# Patient Record
Sex: Female | Born: 1955 | Race: Black or African American | Hispanic: No | Smoking: Never smoker
Health system: Southern US, Community
[De-identification: ages and names within clinical notes are randomized; demographics above are authoritative.]

## PROBLEM LIST (undated history)

## (undated) DIAGNOSIS — R5383 Other fatigue: Secondary | ICD-10-CM

## (undated) DIAGNOSIS — F419 Anxiety disorder, unspecified: Secondary | ICD-10-CM

## (undated) DIAGNOSIS — K219 Gastro-esophageal reflux disease without esophagitis: Secondary | ICD-10-CM

## (undated) DIAGNOSIS — D649 Anemia, unspecified: Secondary | ICD-10-CM

## (undated) DIAGNOSIS — E559 Vitamin D deficiency, unspecified: Secondary | ICD-10-CM

## (undated) DIAGNOSIS — Z9289 Personal history of other medical treatment: Secondary | ICD-10-CM

## (undated) DIAGNOSIS — I10 Essential (primary) hypertension: Secondary | ICD-10-CM

## (undated) DIAGNOSIS — M1990 Unspecified osteoarthritis, unspecified site: Secondary | ICD-10-CM

## (undated) DIAGNOSIS — J45909 Unspecified asthma, uncomplicated: Secondary | ICD-10-CM

## (undated) HISTORY — DX: Other fatigue: R53.83

## (undated) HISTORY — DX: Vitamin D deficiency, unspecified: E55.9

## (undated) HISTORY — PX: REDUCTION MAMMAPLASTY: SUR839

## (undated) HISTORY — PX: ROTATOR CUFF REPAIR: SHX139

## (undated) HISTORY — DX: Essential (primary) hypertension: I10

## (undated) HISTORY — PX: CHOLECYSTECTOMY: SHX55

## (undated) HISTORY — DX: Unspecified asthma, uncomplicated: J45.909

## (undated) HISTORY — PX: CARPAL TUNNEL RELEASE: SHX101

## (undated) HISTORY — DX: Anemia, unspecified: D64.9

---

## 2004-02-02 ENCOUNTER — Encounter: Admission: RE | Admit: 2004-02-02 | Discharge: 2004-05-02 | Payer: Self-pay | Admitting: Orthopedic Surgery

## 2004-04-26 ENCOUNTER — Encounter: Admission: RE | Admit: 2004-04-26 | Discharge: 2004-04-26 | Payer: Self-pay | Admitting: Orthopedic Surgery

## 2004-05-03 ENCOUNTER — Encounter: Admission: RE | Admit: 2004-05-03 | Discharge: 2004-06-12 | Payer: Self-pay | Admitting: Orthopedic Surgery

## 2004-06-11 ENCOUNTER — Encounter: Admission: RE | Admit: 2004-06-11 | Discharge: 2004-06-11 | Payer: Self-pay | Admitting: Orthopedic Surgery

## 2004-07-30 ENCOUNTER — Ambulatory Visit (HOSPITAL_BASED_OUTPATIENT_CLINIC_OR_DEPARTMENT_OTHER): Admission: RE | Admit: 2004-07-30 | Discharge: 2004-07-30 | Payer: Self-pay | Admitting: Orthopedic Surgery

## 2004-07-30 HISTORY — PX: SHOULDER ARTHROSCOPY: SHX128

## 2004-08-05 ENCOUNTER — Encounter: Admission: RE | Admit: 2004-08-05 | Discharge: 2004-11-03 | Payer: Self-pay | Admitting: Orthopedic Surgery

## 2004-11-04 ENCOUNTER — Encounter: Admission: RE | Admit: 2004-11-04 | Discharge: 2005-02-02 | Payer: Self-pay | Admitting: Orthopedic Surgery

## 2007-10-01 ENCOUNTER — Encounter: Admission: RE | Admit: 2007-10-01 | Discharge: 2007-10-01 | Payer: Self-pay | Admitting: Family Medicine

## 2007-10-14 ENCOUNTER — Ambulatory Visit: Payer: Self-pay | Admitting: Internal Medicine

## 2007-10-14 DIAGNOSIS — R0989 Other specified symptoms and signs involving the circulatory and respiratory systems: Secondary | ICD-10-CM

## 2007-10-14 DIAGNOSIS — R0609 Other forms of dyspnea: Secondary | ICD-10-CM

## 2007-10-14 DIAGNOSIS — R05 Cough: Secondary | ICD-10-CM

## 2007-11-02 ENCOUNTER — Telehealth (INDEPENDENT_AMBULATORY_CARE_PROVIDER_SITE_OTHER): Payer: Self-pay | Admitting: *Deleted

## 2010-11-01 NOTE — Op Note (Signed)
NAMEGAY, MONCIVAIS                 ACCOUNT NO.:  [id number]   MEDICAL RECORD NO.:  [id number]          PATIENT TYPE:  AMB   LOCATION:  DSC                          FACILITY:  [facility]   PHYSICIAN:  Robert A. Thurston Hole, M.D. DATE OF BIRTH:  [birth year]   DATE OF PROCEDURE:  07/30/2004  DATE OF DISCHARGE:                                 OPERATIVE REPORT   PREOPERATIVE DIAGNOSIS:  Right shoulder rotator cuff repair, re-tear.   POSTOPERATIVE DIAGNOSES:  1.  Right shoulder partial tear of previous rotator cuff repair.  2.  Right shoulder mild arthrofibrosis.   PROCEDURE:  1.  Right shoulder examination under anesthesia followed by manipulation.  2.  Right shoulder arthroscopic debridement of partial rotator cuff tear.   SURGEON:  Elana Alm. Thurston Hole, M.D.   ASSISTANT:  Julien Girt, P.A.   ANESTHESIA:  General.   OPERATIVE TIME:  30 minutes.   COMPLICATIONS:  None.   INDICATIONS FOR PROCEDURE:  Ms. Sparano is a 48-year woman who had undergone a  previous rotator cuff repair in June 2006. She has had persistent pain with  exam and MRI documenting a possible re-tear of her rotator cuff repair and  has failed conservative care from this and now to undergo arthroscopy.   DESCRIPTION:  Ms. Peregoy was brought to operating room on July 30, 2004  after an interscalene block had been placed in the holding room by  anesthesia. She was placed on the operative table in supine position. After  being placed under general anesthesia, her right shoulder was examined. She  had mild decrease range of motion with forward flexion 160, abduction 150  internal external rotation of 80 degrees. A gentle manipulation was carried  out breaking up soft adhesions and improving forward flexion to 175,  abduction to 175 internal-external rotation of 85 degrees. The shoulder  remained stable to ligamentous exam. She received Ancef 1 g IV  preoperatively for prophylaxis.  Her right shoulder and arm were then  prepped using sterile DuraPrep and draped using sterile technique.  Originally to the posterior arthroscopic portal, the arthroscope with a pump  attached was placed and through an anterior portal, an arthroscopic probe  was placed. On initial inspection, the articular cartilage in the  glenohumeral joint was intact, the anterior labrum intact, superior labrum,  biceps tendon and anchor intact,  anterior inferior labrum and anterior  inferior glenohumeral ligament complex was intact. The posterior labrum was  intact. Biceps tendon and biceps tendon anchor was intact. The rotator cuff  was noted from its previous repair, the sutures were in place. There was a  small partial tear of the leading edge of the anterior aspect of the  supraspinatus which was debrided but a complete tear was not found.  Inferior capsular recess free of pathology. Subacromial space was entered  and lateral arthroscopic portal was made. Mild amount of recurrent bursitis  was resected. No significant bony impingement was noted. The rotator cuff  repair was well visualized with the previous sutures and no new tearing was  noted on the bursal surface. After this was done,  the shoulder could be  brought through a full range of motion with no impingement on the repair. At  this point, it was felt that all pathology been satisfactorily addressed.  The instruments were removed. Portals closed with 3-0 nylon suture. Sterile  dressings and a sling applied. The patient awakened and taken to recovery  room in stable condition.   FOLLOW-UP CARE:  Ms. Ottaway will be followed as an outpatient on Percocet for  pain with early physical therapy.  See her back in the office in a week for  sutures out and follow-up.      RAW/MEDQ  D:  07/30/2004  T:  07/30/2004  Job:  [id number]

## 2013-04-25 ENCOUNTER — Emergency Department (INDEPENDENT_AMBULATORY_CARE_PROVIDER_SITE_OTHER)
Admission: EM | Admit: 2013-04-25 | Discharge: 2013-04-25 | Disposition: A | Discharge status: Home / Self Care | Payer: Worker's Compensation | Source: Home / Self Care | Attending: Emergency Medicine | Admitting: Emergency Medicine

## 2013-04-25 ENCOUNTER — Emergency Department (INDEPENDENT_AMBULATORY_CARE_PROVIDER_SITE_OTHER): Payer: Worker's Compensation

## 2013-04-25 ENCOUNTER — Encounter (HOSPITAL_COMMUNITY): Payer: Self-pay | Admitting: Emergency Medicine

## 2013-04-25 DIAGNOSIS — S4980XA Other specified injuries of shoulder and upper arm, unspecified arm, initial encounter: Secondary | ICD-10-CM

## 2013-04-25 DIAGNOSIS — I10 Essential (primary) hypertension: Secondary | ICD-10-CM

## 2013-04-25 DIAGNOSIS — S46001A Unspecified injury of muscle(s) and tendon(s) of the rotator cuff of right shoulder, initial encounter: Secondary | ICD-10-CM

## 2013-04-25 MED ORDER — HYDROCHLOROTHIAZIDE 25 MG PO TABS: 25.0000 mg | ORAL_TABLET | Freq: Every day | ORAL | Status: DC

## 2013-04-25 MED ORDER — OXYCODONE-ACETAMINOPHEN 5-325 MG PO TABS: ORAL_TABLET | ORAL | Status: DC

## 2013-04-25 MED ORDER — CLONIDINE HCL 0.1 MG PO TABS
0.1000 mg | ORAL_TABLET | Freq: Once | ORAL | Status: AC
  Administered 2013-04-25: 0.1 mg via ORAL

## 2013-04-25 MED ORDER — CLONIDINE HCL 0.1 MG PO TABS
ORAL_TABLET | ORAL | Status: AC
  Filled 2013-04-25: qty 1

## 2013-04-25 NOTE — ED Provider Notes (Signed)
Chief Complaint:   Chief Complaint  Patient presents with  . Shoulder Injury    History of Present Illness:   Becky Brooks is a 57 year old female who presents tonight with a workers comp right shoulder injury. This happened 6 days ago. 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 He ran into her shoulder, injuring her right shoulder and her neck area. She reported is a workers comp injury and ever since then she's had pain in the shoulder and it hurts to move in all directions. There is some swelling superiorly. Her shoulder has a fairly good range of motion. She denies any radiation down the arm, numbness, tingling, or weakness. She did have rotator cuff surgery twice about 5 years ago. She rates her pain as an 8/10.  She does not have a prior history of high blood pressure. She denies headaches, dizziness, shortness of breath, or chest pain.  Review of Systems:  Other than noted above, the patient denies any of the following symptoms: Systemic:  No fevers, chills, sweats, or aches.  No fatigue or tiredness. Musculoskeletal:  No joint pain, arthritis, bursitis, swelling, back pain, or neck pain. Neurological:  No muscular weakness, paresthesias, headache, or trouble with speech or coordination.  No dizziness.  PMFSH:  Past medical history, family history, social history, meds, and allergies were reviewed.    Physical Exam:   Vital signs:  BP 197/115  Pulse 99  Temp(Src) 98.2 F (36.8 C) (Oral)  SpO2 98% Gen:  Alert and oriented times 3.  In no distress. Musculoskeletal: Exam of the shoulder reveals pain to palpation superiorly and slight swelling. The shoulder has a full range of motion actively and passively with pain in all directions. Neer test was positive.  Hawkins test was positive.  Empty cans test was positive. Otherwise, all joints had a full a ROM with no swelling, bruising or deformity.   No edema, pulses full. Extremities were warm and pink.  Capillary refill was brisk.  Skin:  Clear, warm and dry.  No rash. Neuro:  Alert and oriented times 3.  Muscle strength was normal.  Sensation was intact to light touch.   Radiology:  Dg Shoulder Right  04/25/2013   CLINICAL DATA:  Right arm injury, shoulder pain  EXAM: RIGHT SHOULDER - 2+ VIEW  COMPARISON:  MRI 06/26/2004  FINDINGS: Glenohumeral joint is intact. No evidence of scapular fracture or humeral fracture. The acromioclavicular joint is intact. The  IMPRESSION: No acute osseous abnormality.   Electronically Signed   By: Genevive Bi M.D.   On: 04/25/2013 21:01   I reviewed the images independently and personally and concur with the radiologist's findings.  Course in Urgent Care Center:   She was given a sling for the shoulder. Her elevated blood pressure she was given clonidine 0.2 mg by mouth.  Assessment:  The primary encounter diagnosis was Rotator cuff injury, right, initial encounter. A diagnosis of Hypertension was also pertinent to this visit.  She'll need to followup with occupational medicine for the rotator cuff injury and with her own private primary care physician for blood pressure.  Plan:   1.  Meds:  The following meds were prescribed:   New Prescriptions   HYDROCHLOROTHIAZIDE (HYDRODIURIL) 25 MG TABLET    Take 1 tablet (25 mg total) by mouth daily.   OXYCODONE-ACETAMINOPHEN (PERCOCET) 5-325 MG PER TABLET    1 to  2 tablets every 6 hours as needed for pain.    2.  Patient Education/Counseling:  The patient was given appropriate handouts, self care instructions, and instructed in symptomatic relief.  Advised rest and ice.  3.  Follow up:  The patient was told to follow up if no better in 3 to 4 days, if becoming worse in any way, and given some red flag symptoms such as increasing pain which would prompt immediate return.  Follow up with the [facility] for the shoulder  injury, and with Dr. Maryelizabeth Rowan for the blood pressure.      Reuben Likes, MD 04/25/13 [phone]

## 2013-04-25 NOTE — ED Notes (Signed)
[employer] employee; reportedly sustained injury to shoulder today

## 2013-08-17 ENCOUNTER — Other Ambulatory Visit: Payer: Self-pay | Admitting: Family Medicine

## 2013-08-17 DIAGNOSIS — R229 Localized swelling, mass and lump, unspecified: Principal | ICD-10-CM

## 2013-08-17 DIAGNOSIS — IMO0002 Reserved for concepts with insufficient information to code with codable children: Secondary | ICD-10-CM

## 2013-08-23 ENCOUNTER — Ambulatory Visit
Admission: RE | Admit: 2013-08-23 | Discharge: 2013-08-23 | Disposition: A | Discharge status: Home / Self Care | Payer: BC Managed Care – PPO | Source: Ambulatory Visit | Attending: Family Medicine | Admitting: Family Medicine

## 2013-08-23 DIAGNOSIS — R229 Localized swelling, mass and lump, unspecified: Principal | ICD-10-CM

## 2013-08-23 DIAGNOSIS — IMO0002 Reserved for concepts with insufficient information to code with codable children: Secondary | ICD-10-CM

## 2014-08-03 ENCOUNTER — Ambulatory Visit
Admission: RE | Admit: 2014-08-03 | Discharge: 2014-08-03 | Disposition: A | Discharge status: Home / Self Care | Payer: BC Managed Care – PPO | Source: Ambulatory Visit | Attending: Family Medicine | Admitting: Family Medicine

## 2014-08-03 ENCOUNTER — Other Ambulatory Visit: Payer: Self-pay | Admitting: Family Medicine

## 2014-08-03 DIAGNOSIS — R0602 Shortness of breath: Secondary | ICD-10-CM

## 2015-04-03 ENCOUNTER — Ambulatory Visit
Admission: RE | Admit: 2015-04-03 | Discharge: 2015-04-03 | Disposition: A | Discharge status: Home / Self Care | Payer: BC Managed Care – PPO | Source: Ambulatory Visit | Attending: Family Medicine | Admitting: Family Medicine

## 2015-04-03 ENCOUNTER — Other Ambulatory Visit: Payer: Self-pay | Admitting: Family Medicine

## 2015-04-03 DIAGNOSIS — R109 Unspecified abdominal pain: Secondary | ICD-10-CM

## 2015-04-03 DIAGNOSIS — D649 Anemia, unspecified: Secondary | ICD-10-CM

## 2015-04-03 DIAGNOSIS — R1011 Right upper quadrant pain: Secondary | ICD-10-CM

## 2015-04-06 ENCOUNTER — Ambulatory Visit
Admission: RE | Admit: 2015-04-06 | Discharge: 2015-04-06 | Disposition: A | Discharge status: Home / Self Care | Payer: BC Managed Care – PPO | Source: Ambulatory Visit | Attending: Family Medicine | Admitting: Family Medicine

## 2015-04-06 DIAGNOSIS — D649 Anemia, unspecified: Secondary | ICD-10-CM

## 2015-04-06 DIAGNOSIS — R1011 Right upper quadrant pain: Secondary | ICD-10-CM

## 2016-05-16 DIAGNOSIS — Z9289 Personal history of other medical treatment: Secondary | ICD-10-CM

## 2016-05-16 HISTORY — DX: Personal history of other medical treatment: Z92.89

## 2016-05-28 ENCOUNTER — Other Ambulatory Visit: Payer: Self-pay | Admitting: Family Medicine

## 2016-05-28 ENCOUNTER — Ambulatory Visit
Admission: RE | Admit: 2016-05-28 | Discharge: 2016-05-28 | Disposition: A | Discharge status: Home / Self Care | Payer: BC Managed Care – PPO | Source: Ambulatory Visit | Attending: Family Medicine | Admitting: Family Medicine

## 2016-05-28 DIAGNOSIS — R1031 Right lower quadrant pain: Secondary | ICD-10-CM

## 2016-05-29 ENCOUNTER — Encounter: Payer: Self-pay | Admitting: Gastroenterology

## 2016-05-29 ENCOUNTER — Ambulatory Visit (INDEPENDENT_AMBULATORY_CARE_PROVIDER_SITE_OTHER): Payer: BC Managed Care – PPO | Admitting: Gastroenterology

## 2016-05-29 VITALS — BP 144/74 | HR 80 | Ht 64.0 in | Wt 204.0 lb

## 2016-05-29 DIAGNOSIS — D509 Iron deficiency anemia, unspecified: Secondary | ICD-10-CM | POA: Diagnosis not present

## 2016-05-29 DIAGNOSIS — K921 Melena: Secondary | ICD-10-CM | POA: Diagnosis not present

## 2016-05-29 DIAGNOSIS — R109 Unspecified abdominal pain: Secondary | ICD-10-CM | POA: Diagnosis not present

## 2016-05-29 MED ORDER — OMEPRAZOLE 40 MG PO CPDR: 40.0000 mg | DELAYED_RELEASE_CAPSULE | Freq: Every day | ORAL | 3 refills | Status: DC

## 2016-05-29 NOTE — Progress Notes (Addendum)
HPI :  60 y/o female with history of hypertension, anemia, asthma, referred for iron deficiency anemia, abdominal pain, heme positive stools.   Labs from primary care on 12/5 show Hgb of 6.8, WBC 8.7, MCV 68, plt 364 on 12/5 Ferritin 7 LFTs / BMP normal,   She had followed up blood work yesterday, 12/13 showing Hgb of 6.6, MCV 67, WBC 5.7  She is unsure of how long she has had anemia. She feels fatigued, ongoing for the past year or so, getting worse.  Abdominal pain in the RLQ. She thinks this has been ongoing for 2 years or so. The pain used to be intermittent but now feels tender and dull pain more frequently. She has daily pains, last hours at a time. She feels pain level can fluctuate. Pain rated 3-4/10 most of the time but can increase at times and wake her at night at times. She had an [place] in 10/16 with steatosis noted but no pathology otherwise. She has been periodically taking advil / ibuprofen / goody powder - she takes them for shoulder pains / neck pains. She thinks taking most days of the week on some weeks, other times just PRN. When she lies down on her left side it can help her pain.   She reports she sees "reddish brown stools" at times, and stools are FOBT (+). She has had dark stools on iron since November, now on ferrous sulfate TID, although mostly reddish brown. No tarry / sticky stools, denies melena. She is having variable BM frequency, anywhere from 0 to 2-3 times / day. She reports regular stool form. She is not sure if she has lost any weight. No prior colonoscopy. No FH of colon cancer. She is menopausal.    [place] 04/06/15 - steatosis  Past Medical History:  Diagnosis Date  . Anemia   . Asthma   . Essential hypertension   . Fatigue   . Vitamin D deficiency      Past Surgical History:  Procedure Laterality Date  . BREAST REDUCTION SURGERY    . CARPAL TUNNEL RELEASE    . CHOLECYSTECTOMY    . ROTATOR CUFF REPAIR     Family History  Problem Relation Age of  Onset  . Heart disease Father   . Thyroid disease Father     and pituitary gland   . Thyroid disease Sister   . Liver disease Maternal Grandfather   . Colon cancer Neg Hx   . Stomach cancer Neg Hx   . Rectal cancer Neg Hx   . Esophageal cancer Neg Hx   . Liver cancer Neg Hx    Social History  Substance Use Topics  . Smoking status: Never Smoker  . Smokeless tobacco: Never Used  . Alcohol use Yes     Comment: rarely   Current Outpatient Prescriptions  Medication Sig Dispense Refill  . albuterol (ACCUNEB) 0.63 MG/3ML nebulizer solution USE 1 VIAL IN NEBULIZER 3 TO 4 TIMES DAILY AS NEEDED  1  . ferrous sulfate 325 (65 FE) MG tablet Take 325 mg by mouth 3 (three) times daily.    . Fish Oil-Cholecalciferol (FISH OIL + D3) 1000-1000 MG-UNIT CAPS Take by mouth daily.    . methocarbamol (ROBAXIN) 500 MG tablet TAKE 1 TABLET BY MOUTH EVERY 6 HOURS AS NEEDED FOR SPASM  0  . hydrochlorothiazide (HYDRODIURIL) 25 MG tablet Take 1 tablet (25 mg total) by mouth daily. (Patient not taking: Reported on 05/29/2016) 30 tablet 0   No  current facility-administered medications for this visit.    Allergies  Allergen Reactions  . Aspirin     REACTION: makes stomach hurt     Review of Systems: All systems reviewed and negative except where noted in HPI.    Dg Abd Acute W/chest  Result Date: 05/28/2016 CLINICAL DATA:  Initial evaluation for intermittent right lower quadrant pain for 1 year, nausea, vomiting. Fatigue and dizziness. History of prior cholecystectomy. EXAM: DG ABDOMEN ACUTE W/ 1V CHEST COMPARISON:  Prior radiograph from 04/03/2015. FINDINGS: Cardiac and mediastinal silhouettes are within normal limits. Lungs are normally inflated. No focal infiltrates. No pulmonary edema or pleural effusion. No pneumothorax. Bowel gas pattern within normal limits without evidence for obstruction or ileus. No abnormal bowel wall thickening. No free air. No soft tissue mass or abnormal calcification.  Cholecystectomy clips noted. 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 Mild degenerative osteoarthritic changes noted about the hips. No acute osseous abnormality. IMPRESSION: 1. Nonobstructive bowel gas pattern with no radiographic evidence for acute intra-abdominal process. 2. No active cardiopulmonary disease. 3. Sequela of prior cholecystectomy. Electronically Signed   By: Jeannine Boga M.D.   On: 05/28/2016 13:58   Labs per HPI as above  Physical Exam: BP (!) 144/74   Pulse 80   Ht 5\' 4"  (1.626 m)   Wt 204 lb (92.5 kg)   BMI 35.02 kg/m  Constitutional: Pleasant,well-developed, female in no acute distress. HEENT: Normocephalic and atraumatic. Conjunctivae are normal. No scleral icterus. Neck supple.  Cardiovascular: Normal rate, regular rhythm.  Pulmonary/chest: Effort normal and breath sounds normal. No wheezing, rales or rhonchi. Abdominal: Soft, nondistended, right mid abdomen TTP without reound or guarding There are no masses palpable. No hepatomegaly. Extremities: no edema Lymphadenopathy: No cervical adenopathy noted. Neurological: Alert and oriented to person place and time. Skin: Skin is warm and dry. No rashes noted. Psychiatric: Normal mood and affect. Behavior is normal.   ASSESSMENT AND PLAN: 60 year old female here for new patient evaluation for severe iron deficiency anemia, RLQ abdominal pain, and blood in stools. Her hemoglobin has dropped from 6.8 to 6.6 over the past 10 days or so, although just recently started on iron, stools baseline dark with this but has had some red blood in the stools. Hemodynamically stable. She's had significant intermittent NSAID use, at risk for PUD but BUN is normal and denies symptoms of melena. She has had no prior CRC screening. While this appears to be more of a chronic process, given her severe anemia and ongoing fatigue recommend PRBC transfusion to be done within the next 24 hours and we will coordinate this  for her. Otherwise recommend upper and lower endoscopy to evaluate her anemia, tentatively have her scheduled for this within a week. I discussed risks and benefits and she wished to proceed. Given her chronic ongoing abdominal pain, also recommend CT scan of abdomen and pelvis with IV contrast. In the interim I asked her to avoid all NSAIDs and take omeprazole 40 mg once daily empirically given her NSAID use. She will also continue on iron 3 times a day. If she has worsening of symptoms or fails to respond to transfusion she will need admission to the hospital for expedited workup. She will contact us in this situation and will await follow up lab. Otherwise hope to have results of CT and endoscopy within upcoming few days.   Kasigluk Cellar, MD [facility] Pager [phone]

## 2016-05-29 NOTE — Patient Instructions (Signed)
If you are age 59 or older, your body mass index should be between 23-30. Your Body mass index is 35.02 kg/m. If this is out of the aforementioned range listed, please consider follow up with your Primary Care Provider.  If you are age 44 or younger, your body mass index should be between 19-25. Your Body mass index is 35.02 kg/m. If this is out of the aformentioned range listed, please consider follow up with your Primary Care Provider.   We have sent the following medications to your pharmacy for you to pick up at your convenience:  Omeprazole 40mg . Take once daily.  Please take Iron three times daily.  Please discontinue all NSAIDS.  You have been scheduled for a blood transfusion on 05/30/16 at 8:00 am. You will need to arrive at [facility]. Their number is [phone].  You are scheduled on 06/03/16 at [facility] for your CT scan. You should arrive 15 minutes prior to your appointment time for registration. Please follow the written instructions below on the day of your exam:  WARNING: IF YOU ARE ALLERGIC TO IODINE/X-RAY DYE, PLEASE NOTIFY RADIOLOGY IMMEDIATELY AT [phone]! YOU WILL BE GIVEN A 13 HOUR PREMEDICATION PREP.  1) Do not eat or drink anything after 11:00am (4 hours prior to your test) 2) You have been given 2 bottles of oral contrast to drink. The solution may taste               better if refrigerated, but do NOT add ice or any other liquid to this solution. Shake             well before drinking.    Drink 1 bottle of contrast @ 1:00pm (2 hours prior to your exam)  Drink 1 bottle of contrast @ 2:00pm(1 hour prior to your exam)  The purpose of you drinking the oral contrast is to aid in the visualization of your intestinal tract. The contrast solution may cause some diarrhea. Before your exam is started, you will be given a small amount of fluid to drink. Depending on your individual set of symptoms, you may also receive an intravenous injection of  x-ray contrast/dye. Plan on being at [facility] for 30 minutes or longer, depending on the type of exam you are having performed.  This test typically takes 30-45 minutes to complete.  If you have any questions regarding your exam or if you need to reschedule, you may call [facility] at [phone] between the hours of 8:00 am and 5:00 pm, Monday-Friday.  ________________________________________________________________________

## 2016-05-30 ENCOUNTER — Telehealth: Payer: Self-pay

## 2016-05-30 ENCOUNTER — Other Ambulatory Visit: Payer: Self-pay

## 2016-05-30 ENCOUNTER — Ambulatory Visit (HOSPITAL_COMMUNITY)
Admission: RE | Admit: 2016-05-30 | Discharge: 2016-05-30 | Disposition: A | Discharge status: Home / Self Care | Payer: BC Managed Care – PPO | Source: Ambulatory Visit | Attending: Gastroenterology | Admitting: Gastroenterology

## 2016-05-30 DIAGNOSIS — D509 Iron deficiency anemia, unspecified: Secondary | ICD-10-CM

## 2016-05-30 LAB — PREPARE RBC (CROSSMATCH)

## 2016-05-30 LAB — ABO/RH: ABO/RH(D): O POS

## 2016-05-30 MED ORDER — SODIUM CHLORIDE 0.9 % IV SOLN: Freq: Once | INTRAVENOUS | Status: DC

## 2016-05-30 MED ORDER — SODIUM CHLORIDE 0.9 % IV SOLN
Freq: Once | INTRAVENOUS | Status: DC
  Administered 2016-05-30: 10:00:00 via INTRAVENOUS

## 2016-05-30 NOTE — Progress Notes (Signed)
Half way through 2nd unit of PRBC. C/O pressure pain type in chest. 2/10. "Uneasy feeling. VITAL signs 172/70 pulse 79 rep 20 100 % oximeter. Dr. Enis Gash notified.

## 2016-05-30 NOTE — Progress Notes (Signed)
Dr. Havery Moros recommends watching Becky Brooks and if chest pressure/pain continues to have Becky Brooks go to the ED. Becky Brooks aware and will include in discharge instructions.

## 2016-05-30 NOTE — Telephone Encounter (Signed)
Spoke to patient, she is feeling much better now. I let her know that she needs to come in on Monday to have her CBC rechecked. Also let her know that if her symptoms get worse than she needs to go the ED for evaluation, she voices her understanding.

## 2016-05-30 NOTE — Telephone Encounter (Signed)
-----   Message from Manus Gunning, MD sent at 05/29/2016  8:12 PM EST ----- Becky Brooks, This patient is scheduled for a blood transfusion, I believe for Friday. Can you assist in checking where and when (nt sure where [facility] coordinated this), and I would like her to have 2 units PRBC (Initially may have been just one ordered). She should have a follow up CBC on Monday. If she has any worsening of symptoms over the weekend she would need to go to the hospital for evaluation. I am trying to expedite her workup as best I can as outpatient. Thanks

## 2016-05-30 NOTE — Telephone Encounter (Signed)
Thanks for letting me know, we will await CBC Monday. She had 2 units PRBC today.

## 2016-05-30 NOTE — Discharge Instructions (Signed)

## 2016-05-30 NOTE — Progress Notes (Signed)
Procedure: Infusion of 2 units PRBC's  Provider: Dr. Havery Moros   Tolerated infusion well. No reaction. Alert, oriented and ambulatory at time of discharge. Went over discharge instructions and patient states an understanding.

## 2016-06-02 ENCOUNTER — Other Ambulatory Visit (INDEPENDENT_AMBULATORY_CARE_PROVIDER_SITE_OTHER): Payer: BC Managed Care – PPO

## 2016-06-02 DIAGNOSIS — D509 Iron deficiency anemia, unspecified: Secondary | ICD-10-CM

## 2016-06-02 LAB — CBC WITH DIFFERENTIAL/PLATELET
Basophils Absolute: 0.1 10*3/uL (ref 0.0–0.1)
Basophils Relative: 0.8 % (ref 0.0–3.0)
EOS PCT: 5.1 % — AB (ref 0.0–5.0)
Eosinophils Absolute: 0.4 10*3/uL (ref 0.0–0.7)
HCT: 29.6 % — ABNORMAL LOW (ref 36.0–46.0)
Hemoglobin: 9.3 g/dL — ABNORMAL LOW (ref 12.0–15.0)
LYMPHS ABS: 1.9 10*3/uL (ref 0.7–4.0)
Lymphocytes Relative: 25.2 % (ref 12.0–46.0)
MCHC: 31.4 g/dL (ref 30.0–36.0)
MONOS PCT: 7 % (ref 3.0–12.0)
Monocytes Absolute: 0.5 10*3/uL (ref 0.1–1.0)
NEUTROS ABS: 4.6 10*3/uL (ref 1.4–7.7)
NEUTROS PCT: 61.9 % (ref 43.0–77.0)
PLATELETS: 468 10*3/uL — AB (ref 150.0–400.0)
RBC: 4.12 Mil/uL (ref 3.87–5.11)
RDW: 24.4 % — ABNORMAL HIGH (ref 11.5–15.5)
WBC: 7.5 10*3/uL (ref 4.0–10.5)

## 2016-06-02 LAB — TYPE AND SCREEN
Blood Product Expiration Date: 201712272359
Blood Product Expiration Date: 201801012359
ISSUE DATE / TIME: 201712150952
ISSUE DATE / TIME: 201712151000
UNIT TYPE AND RH: 5100
UNIT TYPE AND RH: 5100

## 2016-06-03 ENCOUNTER — Encounter (HOSPITAL_COMMUNITY): Payer: Self-pay

## 2016-06-03 ENCOUNTER — Ambulatory Visit (HOSPITAL_COMMUNITY)
Admission: RE | Admit: 2016-06-03 | Discharge: 2016-06-03 | Disposition: A | Discharge status: Home / Self Care | Payer: BC Managed Care – PPO | Source: Ambulatory Visit | Attending: Gastroenterology | Admitting: Gastroenterology

## 2016-06-03 DIAGNOSIS — R109 Unspecified abdominal pain: Secondary | ICD-10-CM | POA: Diagnosis present

## 2016-06-03 DIAGNOSIS — K921 Melena: Secondary | ICD-10-CM | POA: Diagnosis present

## 2016-06-03 DIAGNOSIS — I7090 Unspecified atherosclerosis: Secondary | ICD-10-CM | POA: Diagnosis not present

## 2016-06-03 DIAGNOSIS — D509 Iron deficiency anemia, unspecified: Secondary | ICD-10-CM | POA: Insufficient documentation

## 2016-06-03 MED ORDER — IOPAMIDOL (ISOVUE-300) INJECTION 61%
INTRAVENOUS | Status: AC
  Administered 2016-06-03: 100 mL
  Filled 2016-06-03: qty 100

## 2016-06-03 MED ORDER — IOPAMIDOL (ISOVUE-300) INJECTION 61%
INTRAVENOUS | Status: AC
  Filled 2016-06-03: qty 100

## 2016-06-05 ENCOUNTER — Telehealth: Payer: Self-pay | Admitting: Gastroenterology

## 2016-06-05 ENCOUNTER — Encounter: Payer: Self-pay | Admitting: Gastroenterology

## 2016-06-05 ENCOUNTER — Ambulatory Visit (AMBULATORY_SURGERY_CENTER): Payer: BC Managed Care – PPO | Admitting: Gastroenterology

## 2016-06-05 ENCOUNTER — Encounter: Payer: BC Managed Care – PPO | Admitting: Gastroenterology

## 2016-06-05 VITALS — BP 135/70 | HR 72 | Temp 98.4°F | Resp 17 | Ht 64.0 in | Wt 204.0 lb

## 2016-06-05 DIAGNOSIS — R935 Abnormal findings on diagnostic imaging of other abdominal regions, including retroperitoneum: Secondary | ICD-10-CM

## 2016-06-05 DIAGNOSIS — D374 Neoplasm of uncertain behavior of colon: Secondary | ICD-10-CM

## 2016-06-05 DIAGNOSIS — D508 Other iron deficiency anemias: Secondary | ICD-10-CM | POA: Diagnosis present

## 2016-06-05 HISTORY — PX: COLONOSCOPY: SHX174

## 2016-06-05 MED ORDER — SODIUM CHLORIDE 0.9 % IV SOLN: 500.0000 mL | INTRAVENOUS | Status: AC

## 2016-06-05 NOTE — Progress Notes (Signed)
Assisted patient to bathroom to aide in expelling of air. Stated I passed some air and feel better.

## 2016-06-05 NOTE — Progress Notes (Signed)
Pathology sent [name] per D.O.

## 2016-06-05 NOTE — Telephone Encounter (Signed)
Called patient. 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 She had this scheduled ASAP to evaluate her colon.   I spoke with Erasmo Downer who will speak with [facility], there must be miscommunication about her situation on her insurance side, as this is scheduled in the [place]. We will proceed with colonoscopy which is scheduled at 130, advised patient to show up at 1230 and this issue will be dealt with her insurance company in the interim. Please add this case back to the schedule at 130 for colonoscopy. Thank you

## 2016-06-05 NOTE — Progress Notes (Signed)
A and O x3. Report to RN. Tolerated MAC anesthesia well. 

## 2016-06-05 NOTE — Telephone Encounter (Signed)
Patient called at 11:00 to cancel colon appointment at 1:30. States she called her insurance this morning and ins told her that she would have to pay $1000 unless the procedure was done in office. Pt states she doesn't have that money and has to cancel. Do you want to bill?

## 2016-06-05 NOTE — Progress Notes (Signed)
Called to room to assist during endoscopic procedure.  Patient ID and intended procedure confirmed with present staff. Received instructions for my participation in the procedure from the performing physician.  

## 2016-06-05 NOTE — Op Note (Signed)
[facility] Patient Name: Becky Brooks Procedure Date: 06/05/2016 12:54 PM MRN: [id number] Endoscopist: Remo Lipps P. Armbruster MD, MD Age: 60 Referring MD:  Date of Birth: May 28, 1956 Gender: Female Account #: [id number] Procedure:                Colonoscopy Indications:              anemia secondary to chronic blood loss, abdominal                            pain, +Abnormal CT of the GI tract suspicious for                            mass in the right colon / cecum Medicines:                Monitored Anesthesia Care Procedure:                Pre-Anesthesia Assessment:                           - Prior to the procedure, a History and Physical                            was performed, and patient medications and                            allergies were reviewed. The patient's tolerance of                            previous anesthesia was also reviewed. The risks                            and benefits of the procedure and the sedation                            options and risks were discussed with the patient.                            All questions were answered, and informed consent                            was obtained. Prior Anticoagulants: The patient has                            taken no previous anticoagulant or antiplatelet                            agents. ASA Grade Assessment: III - A patient with                            severe systemic disease. After reviewing the risks                            and benefits, the patient was deemed in  satisfactory condition to undergo the procedure.                           After obtaining informed consent, the colonoscope                            was passed under direct vision. Throughout the                            procedure, the patient's blood pressure, pulse, and                            oxygen saturations were monitored continuously. The                            Model CF-HQ190L  [id number]) scope was introduced                            through the anus with the intention of advancing to                            the cecum. The scope was advanced to the ascending                            colon before the procedure was aborted. Medications                            were given. The colonoscopy was performed without                            difficulty. The patient tolerated the procedure                            well. The quality of the bowel preparation was                            good. The rectum was photographed. Scope In: 1:33:59 PM Scope Out: 1:52:01 PM Scope Withdrawal Time: 0 hours 14 minutes 5 seconds  Total Procedure Duration: 0 hours 18 minutes 2 seconds  Findings:                 The perianal and digital rectal examinations were                            normal.                           A fungating partially obstructing large mass was                            found in the proximal ascending colon / cecum. The                            mass was circumferential with a narrowed lumen, and  was not able to traverse it with the colonoscope or                            identify cecal landmarks. Biopsies were taken with                            a cold forceps for histology. Area was tattooed                            with an injection of Spot (carbon black) just                            distal to the mass.                           Internal hemorrhoids were found during retroflexion.                           The exam was otherwise without abnormality. No                            other polyps Complications:            No immediate complications. Estimated blood loss:                            Minimal. Estimated Blood Loss:     Estimated blood loss was minimal. Impression:               - Likely malignant partially obstructing tumor in                            the proximal ascending colon / cecum Biopsied.                             Tattooed.                           - Internal hemorrhoids.                           - The examination was otherwise normal. Recommendation:           - Patient has a contact number available for                            emergencies. The signs and symptoms of potential                            delayed complications were discussed with the                            patient. Return to normal activities tomorrow.                            Written discharge instructions were provided to the  patient.                           - Resume previous diet.                           - Continue present medications.                           - Await pathology results. Additional staging                            imaging (CT chest recommended if mass is malignant,                            CEA)                           - General surgery consultation to discuss operative                            management.                           - Continue iron supplementation                           - Start Miralax once daily to keep stools loose,                            prevent obstruction Remo Lipps P. Armbruster MD, MD 06/05/2016 1:59:17 PM This report has been signed electronically.

## 2016-06-05 NOTE — Patient Instructions (Addendum)
Discharge instructions given. Biopsies taken. Resume previous medications. Await pathology. YOU HAD AN ENDOSCOPIC PROCEDURE TODAY AT [facility]:   Refer to the procedure report that was given to you for any specific questions about what was found during the examination.  If the procedure report does not answer your questions, please call your gastroenterologist to clarify.  If you requested that your care partner not be given the details of your procedure findings, then the procedure report has been included in a sealed envelope for you to review at your convenience later.  YOU SHOULD EXPECT: Some feelings of bloating in the abdomen. Passage of more gas than usual.  Walking can help get rid of the air that was put into your GI tract during the procedure and reduce the bloating. If you had a lower endoscopy (such as a colonoscopy or flexible sigmoidoscopy) you may notice spotting of blood in your stool or on the toilet paper. If you underwent a bowel prep for your procedure, you may not have a normal bowel movement for a few days.  Please Note:  You might notice some irritation and congestion in your nose or some drainage.  This is from the oxygen used during your procedure.  There is no need for concern and it should clear up in a day or so.  SYMPTOMS TO REPORT IMMEDIATELY:   Following lower endoscopy (colonoscopy or flexible sigmoidoscopy):  Excessive amounts of blood in the stool  Significant tenderness or worsening of abdominal pains  Swelling of the abdomen that is new, acute  Fever of 100F or higher   For urgent or emergent issues, a gastroenterologist can be reached at any hour by calling [phone].   DIET:  We do recommend a small meal at first, but then you may proceed to your regular diet.  Drink plenty of fluids but you should avoid alcoholic beverages for 24 hours.  ACTIVITY:  You should plan to take it easy for the rest of today and you should NOT DRIVE or  use heavy machinery until tomorrow (because of the sedation medicines used during the test).    FOLLOW UP: Our staff will call the number listed on your records the next business day following your procedure to check on you and address any questions or concerns that you may have regarding the information given to you following your procedure. If we do not reach you, we will leave a message.  However, if you are feeling well and you are not experiencing any problems, there is no need to return our call.  We will assume that you have returned to your regular daily activities without incident.  If any biopsies were taken you will be contacted by phone or by letter within the next 1-3 weeks.  Please call us at [phone] if you have not heard about the biopsies in 3 weeks.    SIGNATURES/CONFIDENTIALITY: You and/or your care partner have signed paperwork which will be entered into your electronic medical record.  These signatures attest to the fact that that the information above on your After Visit Summary has been reviewed and is understood.  Full responsibility of the confidentiality of this discharge information lies with you and/or your care-partner.

## 2016-06-06 ENCOUNTER — Other Ambulatory Visit: Payer: Self-pay | Admitting: Gastroenterology

## 2016-06-06 ENCOUNTER — Telehealth: Payer: Self-pay

## 2016-06-06 DIAGNOSIS — K6389 Other specified diseases of intestine: Secondary | ICD-10-CM

## 2016-06-06 NOTE — Telephone Encounter (Signed)
-----   Message from Manus Gunning, MD sent at 06/05/2016  4:45 PM EST ----- Irven Shelling, This patient had her colonoscopy today, large right sided mass, almost certainly colon cancer. Can you coordinate an appointment with general surgery as soon as possible. Dr. Johney Maine or Dr. Marcello Moores preferred but if the wait is long for them, first available. Thanks  Once I get the path back she will need CT chest and CEA level, will let you know. Thanks Dr. Loni Muse

## 2016-06-06 NOTE — Telephone Encounter (Signed)
  Follow up Call-  Call back number 06/05/2016  Post procedure Call Back phone  # [phone]  Permission to leave phone message Yes  Some recent data might be hidden     Patient questions:  Do you have a fever, pain , or abdominal swelling? No. Pain Score  0 *  Have you tolerated food without any problems? Yes.    Have you been able to return to your normal activities? Yes.    Do you have any questions about your discharge instructions: Diet   No. Medications  No. Follow up visit  No.  Do you have questions or concerns about your Care? No.  Actions: * If pain score is 4 or above: No action needed, pain <4.

## 2016-06-06 NOTE — Telephone Encounter (Signed)
Referral information faxed to [facility], requesting ASAP appointment. Prefer Dr. Johney Maine or Dr. Marcello Moores, if their schedules are booked to far out, then next available surgeon.

## 2016-06-06 NOTE — Telephone Encounter (Signed)
Called [facility], they have scheduled patient to see Dr. Donne Hazel on 06/23/16 at 3:40-patient aware of appointment.

## 2016-06-10 ENCOUNTER — Other Ambulatory Visit: Payer: Self-pay

## 2016-06-10 DIAGNOSIS — K6389 Other specified diseases of intestine: Secondary | ICD-10-CM

## 2016-06-11 ENCOUNTER — Ambulatory Visit (INDEPENDENT_AMBULATORY_CARE_PROVIDER_SITE_OTHER)
Admission: RE | Admit: 2016-06-11 | Discharge: 2016-06-11 | Disposition: A | Discharge status: Home / Self Care | Payer: BC Managed Care – PPO | Source: Ambulatory Visit | Attending: Gastroenterology | Admitting: Gastroenterology

## 2016-06-11 ENCOUNTER — Other Ambulatory Visit (INDEPENDENT_AMBULATORY_CARE_PROVIDER_SITE_OTHER): Payer: BC Managed Care – PPO

## 2016-06-11 DIAGNOSIS — K6389 Other specified diseases of intestine: Secondary | ICD-10-CM

## 2016-06-11 DIAGNOSIS — K639 Disease of intestine, unspecified: Secondary | ICD-10-CM | POA: Diagnosis not present

## 2016-06-11 LAB — CBC
HEMATOCRIT: 29.6 % — AB (ref 36.0–46.0)
HEMOGLOBIN: 9.2 g/dL — AB (ref 12.0–15.0)
MCHC: 31 g/dL (ref 30.0–36.0)
MCV: 71.5 fl — AB (ref 78.0–100.0)
PLATELETS: 411 10*3/uL — AB (ref 150.0–400.0)
RBC: 4.15 Mil/uL (ref 3.87–5.11)
RDW: 24.4 % — AB (ref 11.5–15.5)
WBC: 6.5 10*3/uL (ref 4.0–10.5)

## 2016-06-11 MED ORDER — IOPAMIDOL (ISOVUE-300) INJECTION 61%
80.0000 mL | Freq: Once | INTRAVENOUS | Status: AC | PRN
  Administered 2016-06-11: 80 mL via INTRAVENOUS

## 2016-06-12 LAB — CEA: CEA: 1.7 ng/mL

## 2016-06-24 ENCOUNTER — Other Ambulatory Visit: Payer: Self-pay | Admitting: General Surgery

## 2016-06-30 ENCOUNTER — Encounter (HOSPITAL_COMMUNITY): Payer: Self-pay

## 2016-06-30 ENCOUNTER — Encounter (HOSPITAL_COMMUNITY)
Admission: RE | Admit: 2016-06-30 | Discharge: 2016-06-30 | Disposition: A | Discharge status: Home / Self Care | Payer: BC Managed Care – PPO | Source: Ambulatory Visit | Attending: General Surgery | Admitting: General Surgery

## 2016-06-30 DIAGNOSIS — Z01812 Encounter for preprocedural laboratory examination: Secondary | ICD-10-CM | POA: Insufficient documentation

## 2016-06-30 DIAGNOSIS — Z01810 Encounter for preprocedural cardiovascular examination: Secondary | ICD-10-CM | POA: Insufficient documentation

## 2016-06-30 DIAGNOSIS — K6389 Other specified diseases of intestine: Secondary | ICD-10-CM

## 2016-06-30 HISTORY — DX: Unspecified osteoarthritis, unspecified site: M19.90

## 2016-06-30 HISTORY — DX: Gastro-esophageal reflux disease without esophagitis: K21.9

## 2016-06-30 HISTORY — DX: Anxiety disorder, unspecified: F41.9

## 2016-06-30 LAB — CBC WITH DIFFERENTIAL/PLATELET
BASOS PCT: 1 %
Basophils Absolute: 0.1 10*3/uL (ref 0.0–0.1)
Eosinophils Absolute: 0.3 10*3/uL (ref 0.0–0.7)
Eosinophils Relative: 6 %
HEMATOCRIT: 30.6 % — AB (ref 36.0–46.0)
HEMOGLOBIN: 9 g/dL — AB (ref 12.0–15.0)
LYMPHS PCT: 31 %
Lymphs Abs: 1.8 10*3/uL (ref 0.7–4.0)
MCH: 21.9 pg — AB (ref 26.0–34.0)
MCHC: 29.4 g/dL — ABNORMAL LOW (ref 30.0–36.0)
MCV: 74.5 fL — ABNORMAL LOW (ref 78.0–100.0)
MONOS PCT: 6 %
Monocytes Absolute: 0.3 10*3/uL (ref 0.1–1.0)
NEUTROS ABS: 3.3 10*3/uL (ref 1.7–7.7)
Neutrophils Relative %: 56 %
Platelets: 354 10*3/uL (ref 150–400)
RBC: 4.11 MIL/uL (ref 3.87–5.11)
RDW: 21 % — ABNORMAL HIGH (ref 11.5–15.5)
WBC: 5.8 10*3/uL (ref 4.0–10.5)

## 2016-06-30 LAB — COMPREHENSIVE METABOLIC PANEL
ALK PHOS: 95 U/L (ref 38–126)
ALT: 11 U/L — ABNORMAL LOW (ref 14–54)
ANION GAP: 8 (ref 5–15)
AST: 19 U/L (ref 15–41)
Albumin: 3.4 g/dL — ABNORMAL LOW (ref 3.5–5.0)
BILIRUBIN TOTAL: 0.5 mg/dL (ref 0.3–1.2)
BUN: 9 mg/dL (ref 6–20)
CALCIUM: 10 mg/dL (ref 8.9–10.3)
CO2: 25 mmol/L (ref 22–32)
Chloride: 105 mmol/L (ref 101–111)
Creatinine, Ser: 0.55 mg/dL (ref 0.44–1.00)
GFR calc non Af Amer: >60 mL/min (ref >60)
GLUCOSE: 86 mg/dL (ref 65–99)
Potassium: 3.7 mmol/L (ref 3.5–5.1)
Sodium: 138 mmol/L (ref 135–145)
TOTAL PROTEIN: 7.5 g/dL (ref 6.5–8.1)

## 2016-06-30 MED ORDER — CHLORHEXIDINE GLUCONATE CLOTH 2 % EX PADS: 6.0000 | MEDICATED_PAD | Freq: Once | CUTANEOUS | Status: DC

## 2016-06-30 NOTE — Pre-Procedure Instructions (Signed)
    Becky Brooks  06/30/2016      [id number] - [place], [place] [id number] [place] [id number] Phone: [phone] Fax: [phone]    Your procedure is scheduled on 07/03/16.  Report to [facility] Admitting at [place] A.[facility].  Call this number if you have problems the morning of surgery:  [phone]   Remember:  Do not eat food or drink liquids after midnight.  Take these medicines the morning of surgery with A SIP OF WATER --all inhalers,prilosec               Drink Breeze drink 2 hours prior to arrival  -- 930 AM  Do not wear jewelry, make-up or nail polish.  Do not wear lotions, powders, or perfumes, or deoderant.  Do not shave 48 hours prior to surgery.  Men may shave face and neck.  Do not bring valuables to the hospital.  [facility] is not responsible for any belongings or valuables.  Contacts, dentures or bridgework may not be worn into surgery.  Leave your suitcase in the car.  After surgery it may be brought to your room.  For patients admitted to the hospital, discharge time will be determined by your treatment team.  Patients discharged the day of surgery will not be allowed to drive home.   Name and phone number of your driver:      Please read over the following fact sheets that you were given.

## 2016-07-01 LAB — HEMOGLOBIN A1C
Hgb A1c MFr Bld: 5.4 % (ref 4.8–5.6)
MEAN PLASMA GLUCOSE: 108 mg/dL

## 2016-07-01 NOTE — Progress Notes (Signed)
Anesthesia Chart Review:  Pt is a 61 year old female scheduled for laparoscopic assisted R colectomy on 07/03/2016 with Rolm Bookbinder, MD.   PMH includes:  HTN, asthma, anemia, colon mass, GERD. Never smoker. BMI 36  BP (!) 162/75   Pulse 76   Temp 36.8 C (Oral)   Resp (!) 1   Ht 5\' 4"  (1.626 m)   Wt 211 lb 10.3 oz (96 kg)   SpO2 100%   BMI 36.33 kg/m    Medications include: albuterol, iron, prilosec.   Preoperative labs reviewed.  H/H 9.0/30.6. This is consistent with recent prior results. By notes, pt had critical anemia of 6.8 in 05/2016 requiring transfusion.  Dr. Donne Hazel is aware of PAT lab results. Will defer to him whether or not to obtain T&S DOS.   CT chest 06/11/16: No pulmonary nodules.  Airways normal.  EKG 06/30/16: NSR.   If no changes, I anticipate pt can proceed with surgery as scheduled.   Willeen Cass, FNP-BC [facility]/Anesthesiology Phone: [phone] 07/01/2016 2:40 PM

## 2016-07-03 ENCOUNTER — Encounter (HOSPITAL_COMMUNITY): Payer: Self-pay | Admitting: Certified Registered Nurse Anesthetist

## 2016-07-03 ENCOUNTER — Inpatient Hospital Stay (HOSPITAL_COMMUNITY): Payer: BC Managed Care – PPO | Admitting: Emergency Medicine

## 2016-07-03 ENCOUNTER — Encounter (HOSPITAL_COMMUNITY)
Admission: RE | Disposition: A | Discharge status: Home / Self Care | Payer: Self-pay | Source: Ambulatory Visit | Attending: General Surgery

## 2016-07-03 ENCOUNTER — Inpatient Hospital Stay (HOSPITAL_COMMUNITY): Payer: BC Managed Care – PPO | Admitting: Certified Registered Nurse Anesthetist

## 2016-07-03 ENCOUNTER — Inpatient Hospital Stay (HOSPITAL_COMMUNITY)
Admission: RE | Admit: 2016-07-03 | Discharge: 2016-07-11 | DRG: 330 | Disposition: A | Discharge status: Home / Self Care | Payer: BC Managed Care – PPO | Source: Ambulatory Visit | Attending: General Surgery | Admitting: General Surgery

## 2016-07-03 DIAGNOSIS — D509 Iron deficiency anemia, unspecified: Secondary | ICD-10-CM | POA: Diagnosis present

## 2016-07-03 DIAGNOSIS — C189 Malignant neoplasm of colon, unspecified: Secondary | ICD-10-CM | POA: Diagnosis present

## 2016-07-03 DIAGNOSIS — D62 Acute posthemorrhagic anemia: Secondary | ICD-10-CM | POA: Diagnosis not present

## 2016-07-03 DIAGNOSIS — K567 Ileus, unspecified: Secondary | ICD-10-CM | POA: Diagnosis not present

## 2016-07-03 DIAGNOSIS — C182 Malignant neoplasm of ascending colon: Principal | ICD-10-CM | POA: Diagnosis present

## 2016-07-03 DIAGNOSIS — Z8249 Family history of ischemic heart disease and other diseases of the circulatory system: Secondary | ICD-10-CM | POA: Diagnosis not present

## 2016-07-03 DIAGNOSIS — J45909 Unspecified asthma, uncomplicated: Secondary | ICD-10-CM | POA: Diagnosis present

## 2016-07-03 HISTORY — DX: Personal history of other medical treatment: Z92.89

## 2016-07-03 HISTORY — PX: LAPAROSCOPIC RIGHT COLECTOMY: SHX5925

## 2016-07-03 SURGERY — COLECTOMY, RIGHT, LAPAROSCOPIC
Anesthesia: General | Site: Abdomen | Laterality: Right

## 2016-07-03 MED ORDER — SUGAMMADEX SODIUM 200 MG/2ML IV SOLN
INTRAVENOUS | Status: DC | PRN
  Administered 2016-07-03: 150 mg via INTRAVENOUS

## 2016-07-03 MED ORDER — HYDROMORPHONE HCL 1 MG/ML IJ SOLN
0.2500 mg | INTRAMUSCULAR | Status: DC | PRN
  Administered 2016-07-03: 1 mg via INTRAVENOUS
  Administered 2016-07-03: 0.5 mg via INTRAVENOUS

## 2016-07-03 MED ORDER — EPHEDRINE 5 MG/ML INJ
INTRAVENOUS | Status: AC
  Filled 2016-07-03: qty 20

## 2016-07-03 MED ORDER — SUGAMMADEX SODIUM 200 MG/2ML IV SOLN
INTRAVENOUS | Status: AC
  Filled 2016-07-03: qty 4

## 2016-07-03 MED ORDER — SUCCINYLCHOLINE CHLORIDE 200 MG/10ML IV SOSY
PREFILLED_SYRINGE | INTRAVENOUS | Status: AC
  Filled 2016-07-03: qty 20

## 2016-07-03 MED ORDER — ALVIMOPAN 12 MG PO CAPS: 12.0000 mg | ORAL_CAPSULE | Freq: Once | ORAL | Status: DC

## 2016-07-03 MED ORDER — MIDAZOLAM HCL 2 MG/2ML IJ SOLN
INTRAMUSCULAR | Status: AC
  Filled 2016-07-03: qty 2

## 2016-07-03 MED ORDER — PHENYLEPHRINE 40 MCG/ML (10ML) SYRINGE FOR IV PUSH (FOR BLOOD PRESSURE SUPPORT)
PREFILLED_SYRINGE | INTRAVENOUS | Status: DC | PRN
  Administered 2016-07-03 (×3): 80 ug via INTRAVENOUS

## 2016-07-03 MED ORDER — PROMETHAZINE HCL 25 MG/ML IJ SOLN
INTRAMUSCULAR | Status: AC
  Filled 2016-07-03: qty 1

## 2016-07-03 MED ORDER — SODIUM CHLORIDE 0.9 % IV SOLN
INTRAVENOUS | Status: DC
Start: 2016-07-03 — End: 2016-07-06
  Administered 2016-07-03 – 2016-07-04 (×2): via INTRAVENOUS

## 2016-07-03 MED ORDER — DEXTROSE 5 % IV SOLN
INTRAVENOUS | Status: DC | PRN
  Administered 2016-07-03: 25 ug/min via INTRAVENOUS

## 2016-07-03 MED ORDER — NEOMYCIN SULFATE 500 MG PO TABS: 1000.0000 mg | ORAL_TABLET | ORAL | Status: DC

## 2016-07-03 MED ORDER — GABAPENTIN 300 MG PO CAPS: 300.0000 mg | ORAL_CAPSULE | ORAL | Status: DC

## 2016-07-03 MED ORDER — CEFOTETAN DISODIUM-DEXTROSE 2-2.08 GM-% IV SOLR
INTRAVENOUS | Status: AC
  Filled 2016-07-03: qty 50

## 2016-07-03 MED ORDER — ACETAMINOPHEN 500 MG PO TABS
ORAL_TABLET | ORAL | Status: AC
  Administered 2016-07-03: 1000 mg
  Filled 2016-07-03: qty 2

## 2016-07-03 MED ORDER — DEXTROSE 5 % IV SOLN
2.0000 g | INTRAVENOUS | Status: AC
  Administered 2016-07-03: 2 g via INTRAVENOUS

## 2016-07-03 MED ORDER — HYDROMORPHONE HCL 2 MG/ML IJ SOLN
1.0000 mg | INTRAMUSCULAR | Status: DC | PRN
  Administered 2016-07-03 – 2016-07-04 (×4): 1 mg via INTRAVENOUS
  Filled 2016-07-03 (×4): qty 1

## 2016-07-03 MED ORDER — HEPARIN SODIUM (PORCINE) 5000 UNIT/ML IJ SOLN: 5000.0000 [IU] | Freq: Once | INTRAMUSCULAR | Status: DC

## 2016-07-03 MED ORDER — BUPIVACAINE HCL (PF) 0.25 % IJ SOLN
INTRAMUSCULAR | Status: DC | PRN
  Administered 2016-07-03: 15 mL

## 2016-07-03 MED ORDER — LACTATED RINGERS IV SOLN
INTRAVENOUS | Status: DC
  Administered 2016-07-03 (×3): via INTRAVENOUS

## 2016-07-03 MED ORDER — PHENYLEPHRINE 40 MCG/ML (10ML) SYRINGE FOR IV PUSH (FOR BLOOD PRESSURE SUPPORT)
PREFILLED_SYRINGE | INTRAVENOUS | Status: AC
  Filled 2016-07-03: qty 30

## 2016-07-03 MED ORDER — BUPIVACAINE HCL (PF) 0.25 % IJ SOLN
INTRAMUSCULAR | Status: AC
  Filled 2016-07-03: qty 30

## 2016-07-03 MED ORDER — PROCHLORPERAZINE EDISYLATE 5 MG/ML IJ SOLN
10.0000 mg | Freq: Four times a day (QID) | INTRAMUSCULAR | Status: DC | PRN
  Administered 2016-07-05 – 2016-07-10 (×5): 10 mg via INTRAVENOUS
  Filled 2016-07-03 (×6): qty 2

## 2016-07-03 MED ORDER — PROPOFOL 10 MG/ML IV BOLUS
INTRAVENOUS | Status: DC | PRN
  Administered 2016-07-03: 200 mg via INTRAVENOUS

## 2016-07-03 MED ORDER — ROCURONIUM BROMIDE 10 MG/ML (PF) SYRINGE
PREFILLED_SYRINGE | INTRAVENOUS | Status: DC | PRN
  Administered 2016-07-03: 10 mg via INTRAVENOUS
  Administered 2016-07-03: 20 mg via INTRAVENOUS
  Administered 2016-07-03: 40 mg via INTRAVENOUS

## 2016-07-03 MED ORDER — PROMETHAZINE HCL 25 MG/ML IJ SOLN
6.2500 mg | INTRAMUSCULAR | Status: DC | PRN
Start: 2016-07-03 — End: 2016-07-03
  Administered 2016-07-03: 12.5 mg via INTRAVENOUS

## 2016-07-03 MED ORDER — ACETAMINOPHEN 500 MG PO TABS
1000.0000 mg | ORAL_TABLET | ORAL | Status: DC
  Filled 2016-07-03: qty 2

## 2016-07-03 MED ORDER — MEPERIDINE HCL 25 MG/ML IJ SOLN: 6.2500 mg | INTRAMUSCULAR | Status: DC | PRN

## 2016-07-03 MED ORDER — HYDROMORPHONE HCL 1 MG/ML IJ SOLN
INTRAMUSCULAR | Status: AC
  Filled 2016-07-03: qty 1

## 2016-07-03 MED ORDER — ONDANSETRON HCL 4 MG/2ML IJ SOLN
INTRAMUSCULAR | Status: AC
  Filled 2016-07-03: qty 8

## 2016-07-03 MED ORDER — HEPARIN SODIUM (PORCINE) 5000 UNIT/ML IJ SOLN
INTRAMUSCULAR | Status: AC
  Filled 2016-07-03: qty 1

## 2016-07-03 MED ORDER — GABAPENTIN 300 MG PO CAPS
ORAL_CAPSULE | ORAL | Status: AC
  Administered 2016-07-03: 300 mg
  Filled 2016-07-03: qty 1

## 2016-07-03 MED ORDER — ALVIMOPAN 12 MG PO CAPS
12.0000 mg | ORAL_CAPSULE | Freq: Two times a day (BID) | ORAL | Status: DC
  Administered 2016-07-04 – 2016-07-06 (×4): 12 mg via ORAL
  Filled 2016-07-03 (×5): qty 1

## 2016-07-03 MED ORDER — METHOCARBAMOL 1000 MG/10ML IJ SOLN
500.0000 mg | Freq: Three times a day (TID) | INTRAMUSCULAR | Status: DC | PRN
  Administered 2016-07-03 – 2016-07-04 (×2): 500 mg via INTRAVENOUS
  Filled 2016-07-03 (×6): qty 5

## 2016-07-03 MED ORDER — FENTANYL CITRATE (PF) 100 MCG/2ML IJ SOLN
INTRAMUSCULAR | Status: DC | PRN
  Administered 2016-07-03: 25 ug via INTRAVENOUS
  Administered 2016-07-03: 50 ug via INTRAVENOUS
  Administered 2016-07-03: 25 ug via INTRAVENOUS
  Administered 2016-07-03: 100 ug via INTRAVENOUS
  Administered 2016-07-03 (×2): 25 ug via INTRAVENOUS
  Administered 2016-07-03: 50 ug via INTRAVENOUS

## 2016-07-03 MED ORDER — METRONIDAZOLE 500 MG PO TABS: 1000.0000 mg | ORAL_TABLET | ORAL | Status: DC

## 2016-07-03 MED ORDER — ALBUMIN HUMAN 5 % IV SOLN
INTRAVENOUS | Status: DC | PRN
  Administered 2016-07-03 (×2): via INTRAVENOUS

## 2016-07-03 MED ORDER — PROPOFOL 10 MG/ML IV BOLUS
INTRAVENOUS | Status: AC
  Filled 2016-07-03: qty 20

## 2016-07-03 MED ORDER — ENOXAPARIN SODIUM 40 MG/0.4ML ~~LOC~~ SOLN
40.0000 mg | SUBCUTANEOUS | Status: DC
  Administered 2016-07-04 – 2016-07-11 (×8): 40 mg via SUBCUTANEOUS
  Filled 2016-07-03 (×8): qty 0.4

## 2016-07-03 MED ORDER — MORPHINE SULFATE (PF) 2 MG/ML IV SOLN
2.0000 mg | INTRAVENOUS | Status: DC | PRN
  Administered 2016-07-03 – 2016-07-04 (×4): 2 mg via INTRAVENOUS
  Filled 2016-07-03 (×4): qty 1

## 2016-07-03 MED ORDER — ALVIMOPAN 12 MG PO CAPS
ORAL_CAPSULE | ORAL | Status: AC
  Filled 2016-07-03: qty 1

## 2016-07-03 MED ORDER — ONDANSETRON HCL 4 MG/2ML IJ SOLN
INTRAMUSCULAR | Status: DC | PRN
  Administered 2016-07-03: 4 mg via INTRAVENOUS

## 2016-07-03 MED ORDER — ACETAMINOPHEN 325 MG PO TABS
650.0000 mg | ORAL_TABLET | Freq: Four times a day (QID) | ORAL | Status: DC | PRN
  Administered 2016-07-11: 650 mg via ORAL
  Filled 2016-07-03: qty 2

## 2016-07-03 MED ORDER — 0.9 % SODIUM CHLORIDE (POUR BTL) OPTIME
TOPICAL | Status: DC | PRN
  Administered 2016-07-03 (×2): 1000 mL

## 2016-07-03 MED ORDER — MIDAZOLAM HCL 5 MG/5ML IJ SOLN
INTRAMUSCULAR | Status: DC | PRN
  Administered 2016-07-03: 2 mg via INTRAVENOUS

## 2016-07-03 MED ORDER — LIDOCAINE 2% (20 MG/ML) 5 ML SYRINGE
INTRAMUSCULAR | Status: DC | PRN
  Administered 2016-07-03: 100 mg via INTRAVENOUS

## 2016-07-03 MED ORDER — FENTANYL CITRATE (PF) 100 MCG/2ML IJ SOLN
INTRAMUSCULAR | Status: AC
  Filled 2016-07-03: qty 2

## 2016-07-03 MED ORDER — LIDOCAINE 2% (20 MG/ML) 5 ML SYRINGE
INTRAMUSCULAR | Status: AC
  Filled 2016-07-03: qty 15

## 2016-07-03 MED ORDER — FENTANYL CITRATE (PF) 100 MCG/2ML IJ SOLN
INTRAMUSCULAR | Status: AC
  Filled 2016-07-03: qty 4

## 2016-07-03 MED ORDER — ROCURONIUM BROMIDE 50 MG/5ML IV SOSY
PREFILLED_SYRINGE | INTRAVENOUS | Status: AC
  Filled 2016-07-03: qty 20

## 2016-07-03 MED ORDER — ALUM & MAG HYDROXIDE-SIMETH 200-200-20 MG/5ML PO SUSP: 30.0000 mL | Freq: Four times a day (QID) | ORAL | Status: DC | PRN

## 2016-07-03 MED ORDER — LACTATED RINGERS IV SOLN: INTRAVENOUS | Status: DC

## 2016-07-03 MED ORDER — ALBUTEROL SULFATE (2.5 MG/3ML) 0.083% IN NEBU: 2.5000 mg | INHALATION_SOLUTION | Freq: Three times a day (TID) | RESPIRATORY_TRACT | Status: DC | PRN

## 2016-07-03 MED ORDER — PANTOPRAZOLE SODIUM 40 MG PO TBEC
40.0000 mg | DELAYED_RELEASE_TABLET | Freq: Every day | ORAL | Status: DC
  Administered 2016-07-03 – 2016-07-11 (×9): 40 mg via ORAL
  Filled 2016-07-03 (×9): qty 1

## 2016-07-03 SURGICAL SUPPLY — 76 items
APPLIER CLIP 5 13 M/L LIGAMAX5 (MISCELLANEOUS)
APR CLP MED LRG 5 ANG JAW (MISCELLANEOUS)
BLADE SURG ROTATE 9660 (MISCELLANEOUS) ×2 IMPLANT
CANISTER SUCTION 2500CC (MISCELLANEOUS) ×3 IMPLANT
CELLS DAT CNTRL 66122 CELL SVR (MISCELLANEOUS) ×1 IMPLANT
CHLORAPREP W/TINT 26ML (MISCELLANEOUS) ×3 IMPLANT
CLIP APPLIE 5 13 M/L LIGAMAX5 (MISCELLANEOUS) IMPLANT
COVER SURGICAL LIGHT HANDLE (MISCELLANEOUS) ×3 IMPLANT
DRAPE LAPAROSCOPIC ABDOMINAL (DRAPES) ×1 IMPLANT
DRAPE WARM FLUID 44X44 (DRAPE) ×3 IMPLANT
DRSG OPSITE POSTOP 4X8 (GAUZE/BANDAGES/DRESSINGS) ×2 IMPLANT
DRSG TEGADERM 2-3/8X2-3/4 SM (GAUZE/BANDAGES/DRESSINGS) ×8 IMPLANT
ELECT BLADE 6.5 EXT (BLADE) IMPLANT
ELECT CAUTERY BLADE 6.4 (BLADE) ×6 IMPLANT
ELECT REM PT RETURN 9FT ADLT (ELECTROSURGICAL) ×3
ELECTRODE REM PT RTRN 9FT ADLT (ELECTROSURGICAL) ×1 IMPLANT
GAUZE SPONGE 2X2 8PLY STRL LF (GAUZE/BANDAGES/DRESSINGS) IMPLANT
GAUZE SPONGE 4X4 12PLY STRL (GAUZE/BANDAGES/DRESSINGS) IMPLANT
GLOVE BIO SURGEON STRL SZ7 (GLOVE) ×6 IMPLANT
GLOVE BIO SURGEON STRL SZ8 (GLOVE) ×4 IMPLANT
GLOVE BIOGEL PI IND STRL 6.5 (GLOVE) IMPLANT
GLOVE BIOGEL PI IND STRL 7.0 (GLOVE) IMPLANT
GLOVE BIOGEL PI IND STRL 7.5 (GLOVE) ×1 IMPLANT
GLOVE BIOGEL PI IND STRL 8 (GLOVE) IMPLANT
GLOVE BIOGEL PI INDICATOR 6.5 (GLOVE) ×4
GLOVE BIOGEL PI INDICATOR 7.0 (GLOVE) ×2
GLOVE BIOGEL PI INDICATOR 7.5 (GLOVE) ×2
GLOVE BIOGEL PI INDICATOR 8 (GLOVE) ×4
GLOVE SURG SS PI 6.0 STRL IVOR (GLOVE) ×4 IMPLANT
GOWN STRL REUS W/ TWL LRG LVL3 (GOWN DISPOSABLE) ×4 IMPLANT
GOWN STRL REUS W/ TWL XL LVL3 (GOWN DISPOSABLE) IMPLANT
GOWN STRL REUS W/TWL LRG LVL3 (GOWN DISPOSABLE) ×12
GOWN STRL REUS W/TWL XL LVL3 (GOWN DISPOSABLE) ×6
KIT BASIN OR (CUSTOM PROCEDURE TRAY) ×3 IMPLANT
KIT ROOM TURNOVER OR (KITS) ×3 IMPLANT
LIGASURE IMPACT 36 18CM CVD LR (INSTRUMENTS) ×2 IMPLANT
NS IRRIG 1000ML POUR BTL (IV SOLUTION) ×6 IMPLANT
PAD ARMBOARD 7.5X6 YLW CONV (MISCELLANEOUS) ×6 IMPLANT
PENCIL BUTTON HOLSTER BLD 10FT (ELECTRODE) ×5 IMPLANT
RELOAD PROXIMATE 75MM BLUE (ENDOMECHANICALS) ×6 IMPLANT
RELOAD STAPLE 75 3.8 BLU REG (ENDOMECHANICALS) IMPLANT
RETRACTOR WND ALEXIS 18 MED (MISCELLANEOUS) IMPLANT
RTRCTR WOUND ALEXIS 18CM MED (MISCELLANEOUS) ×3
SCALPEL HARMONIC ACE (MISCELLANEOUS) ×2 IMPLANT
SCISSORS LAP 5X35 DISP (ENDOMECHANICALS) ×2 IMPLANT
SET IRRIG TUBING LAPAROSCOPIC (IRRIGATION / IRRIGATOR) IMPLANT
SLEEVE ENDOPATH XCEL 5M (ENDOMECHANICALS) ×7 IMPLANT
SPECIMEN JAR LARGE (MISCELLANEOUS) ×3 IMPLANT
SPONGE GAUZE 2X2 STER 10/PKG (GAUZE/BANDAGES/DRESSINGS) ×2
STAPLER GUN LINEAR PROX 60 (STAPLE) ×2 IMPLANT
STAPLER PROXIMATE 75MM BLUE (STAPLE) ×2 IMPLANT
STAPLER VISISTAT 35W (STAPLE) ×3 IMPLANT
SUCTION POOLE TIP (SUCTIONS) ×3 IMPLANT
SUT PDS AB 1 TP1 54 (SUTURE) IMPLANT
SUT PDS AB 1 TP1 96 (SUTURE) ×4 IMPLANT
SUT SILK 2 0 (SUTURE) ×3
SUT SILK 2 0 SH CR/8 (SUTURE) ×3 IMPLANT
SUT SILK 2-0 18XBRD TIE 12 (SUTURE) ×1 IMPLANT
SUT SILK 3 0 (SUTURE) ×3
SUT SILK 3 0 SH CR/8 (SUTURE) ×5 IMPLANT
SUT SILK 3-0 18XBRD TIE 12 (SUTURE) ×1 IMPLANT
SUT VIC AB 3-0 SH 18 (SUTURE) ×2 IMPLANT
SUT VIC AB 3-0 SH 8-18 (SUTURE) IMPLANT
SYS LAPSCP GELPORT 120MM (MISCELLANEOUS)
SYSTEM LAPSCP GELPORT 120MM (MISCELLANEOUS) IMPLANT
TOWEL OR 17X24 6PK STRL BLUE (TOWEL DISPOSABLE) ×3 IMPLANT
TOWEL OR 17X26 10 PK STRL BLUE (TOWEL DISPOSABLE) ×3 IMPLANT
TRAY FOLEY CATH 14FRSI W/METER (CATHETERS) ×3 IMPLANT
TRAY LAPAROSCOPIC MC (CUSTOM PROCEDURE TRAY) ×3 IMPLANT
TROCAR XCEL BLUNT TIP 100MML (ENDOMECHANICALS) IMPLANT
TROCAR XCEL NON-BLD 11X100MML (ENDOMECHANICALS) ×3 IMPLANT
TROCAR XCEL NON-BLD 5MMX100MML (ENDOMECHANICALS) ×3 IMPLANT
TUBE CONNECTING 12'X1/4 (SUCTIONS) ×2
TUBE CONNECTING 12X1/4 (SUCTIONS) ×3 IMPLANT
TUBING FILTER THERMOFLATOR (ELECTROSURGICAL) ×3 IMPLANT
YANKAUER SUCT BULB TIP NO VENT (SUCTIONS) ×6 IMPLANT

## 2016-07-03 NOTE — Interval H&P Note (Signed)
History and Physical Interval Note:  07/03/2016 12:44 PM  Becky Brooks  has presented today for surgery, with the diagnosis of right colon mass  The various methods of treatment have been discussed with the patient and family. After consideration of risks, benefits and other options for treatment, the patient has consented to  Procedure(s): LAPAROSCOPIC ASSISTED RIGHT COLECTOMY (Right) as a surgical intervention .  The patient's history has been reviewed, patient examined, no change in status, stable for surgery.  I have reviewed the patient's chart and labs.  Questions were answered to the patient's satisfaction.     Dutchess Crosland

## 2016-07-03 NOTE — Anesthesia Preprocedure Evaluation (Signed)
Anesthesia Evaluation  Patient identified by MRN, date of birth, ID band Patient awake    Reviewed: Allergy & Precautions, NPO status , Patient's Chart, lab work & pertinent test results  Airway Mallampati: I       Dental no notable dental hx.    Pulmonary    Pulmonary exam normal        Cardiovascular hypertension, Normal cardiovascular exam     Neuro/Psych negative neurological ROS     GI/Hepatic GERD  Medicated and Controlled,  Endo/Other  negative endocrine ROS  Renal/GU negative Renal ROS     Musculoskeletal   Abdominal (+) + obese,   Peds  Hematology   Anesthesia Other Findings   Reproductive/Obstetrics negative OB ROS                             Anesthesia Physical Anesthesia Plan  ASA: II  Anesthesia Plan: General   Post-op Pain Management:    Induction: Intravenous  Airway Management Planned: Oral ETT  Additional Equipment:   Intra-op Plan:   Post-operative Plan: Extubation in OR  Informed Consent: I have reviewed the patients History and Physical, chart, labs and discussed the procedure including the risks, benefits and alternatives for the proposed anesthesia with the patient or authorized representative who has indicated his/her understanding and acceptance.     Plan Discussed with: CRNA and Surgeon  Anesthesia Plan Comments:         Anesthesia Quick Evaluation

## 2016-07-03 NOTE — Op Note (Signed)
Preoperative diagnosis: right colon cancer Postoperative diagnosis: same as above Procedure: laparoscopic assisted right colectomy Surgeon: Dr Serita Grammes  Asst: Dr Christie Beckers EBL: [phone] Complications none Drains none Specimens right colon and TI to pathology Anesthesia general Sponge and needle count correct at completion dispo to recovery stable.  Indications: This is a 61 year old female who was noted to have iron deficiency anemia after she felt fatigued. She underwent evaluation is found to have a right colon mass consistent with a right colon cancer. She does not have evidence of metastatic disease on her workup. We discussed a laparoscopic-assisted right colectomy.  Procedure: After informed consent was obtained the patient was taken the operating room. She was given antibiotics. She was also given Entereg. She was given subcutaneous heparin. She was then placed under general anesthesia without complication. She had an orogastric tube and a Foley placed. She was then prepped and draped in the standard sterile surgical fashion. A surgical timeout was then performed.  I infiltrate Marcaine in the left upper quadrant. I then made an incision. I then used a 5 mm Optiview trocar under direct vision to enter into the peritoneal cavity without difficulty. I then insufflated the abdomen to 15 mmHg pressure. I then inserted what ended up being 4 additional 5 mm trocars in the left abdomen as well as in the pelvis. Once I done this I was able to identify the cecum. The area of the tumor had a desmoplastic reaction and this was adherent to the anterior abdominal wall. There was not any direct invasion. I then released this with scissors. I then released the white line of Toldt with the harmonic scalpel medializing the right colon. Her transverse colon was adherent to her falciform ligament as well as her abdominal wall. I released this with dissection with the Harmonic scalpel. Her colon was also  very adherent to her liver and this was released with the harmonic scalpel making it so I could roll up her hepatic flexure. Once I had done this I elected to exteriorize the colon. I made a 7 cm incision above the umbilicus. I inserted a wound protector. I exteriorized the whole area. The mass was easily palpable and the tattoo was visible. She had a number of nodes that were enlarged as well. I then divided the terminal ileum with the GIA stapler. I then chose a portion of the transverse colon after releasing the omentum to divide as well. I then removed the mesentery using a combination of the LigaSure device as well as sutures ligatures. I then passed this off the table as a specimen. Hemostasis was observed. I then closed my mesenteric defect with 2-0 silks. I approximated the ileum to the transverse colon with 3-0 silk suture. I then created enterotomies in both. I inserted a GIA stapler and created the anastomosis. I then used a TX stapler to close the common enterotomy. This was hemostatic before doing this. This was patent and viable. I placed 2 2-0 silk apex sutures. Once I had done this displacing the abdomen and the omentum was placed over it. We then did the colon protocol and proceeded to change out all the instruments and re-scrubbed. I then closed the fascia with #1 looped PDS. I then went back in with the laparoscope. Everything looked to be in good position. There was no evidence of any bleeding. I then removed the trocars after desufflated the abdomen. All the incisions were then closed with staples. She tolerated this well was extubated and transferred  to recovery stable.

## 2016-07-03 NOTE — H&P (Signed)
61 yof referred by Dr Jolly Mango presents with right colon mass. for some time she had some fatigue and then eventually was found to have iron deficiency anemia and heme positive stool. she has some rlq pain. she has some n/v. she has liquid bms. she underwent a ct scan that showed an apple core lesion of the cecum with adjacent prominent ileocolonic mesenteric nodes. there is no evidence of metastatic disease in the remainder of the abdomen or chest. cea is normal. on colonoscopy she was found to have a fungating partially obstructing large mass was found in the proximal ascending colon/cecum. the lumen was not able to be traversed. this was tattooed. she is here today to discuss options.  Past Surgical History Malachy Moan, [place]; 06/23/2016 10:19 AM) Breast Augmentation  Bilateral. Cesarean Section - 1  Gallbladder Surgery - Laparoscopic  Oral Surgery  Shoulder Surgery  Right.  Diagnostic Studies History Malachy Moan, [place]; 06/23/2016 10:19 AM) Colonoscopy  within last year Mammogram  never Pap Smear  >5 years ago  Allergies Malachy Moan, RMA; 06/23/2016 10:20 AM) Aspirin 81 *ANALGESICS - NonNarcotic*   Medication History Malachy Moan, RMA; 06/23/2016 10:22 AM) Ferrous Sulfate (325 (65 Fe)MG Tablet, Oral daily) Active. Fish Oil (1000MG  Capsule, Oral daily) Active. PriLOSEC (40MG  Capsule DR, Oral daily) Active. Robaxin (500MG  Tablet, Oral daily) Active. Medications Reconciled  Social History Malachy Moan, [place]; 06/23/2016 10:19 AM) Alcohol use  Occasional alcohol use. Caffeine use  Carbonated beverages, Tea. No drug use  Tobacco use  Never smoker.  Family History Malachy Moan, [place]; 06/23/2016 10:19 AM) Heart disease in female family member before age 46  Hypertension  Father, Sister. Thyroid problems  Father, Sister.  Pregnancy / Birth History Malachy Moan, [place]; 06/23/2016 10:19 AM) Age at menarche  23 years. Age of menopause   62-55 Gravida  81 Maternal age  77-30 Para  36  Other Problems Malachy Moan, [place]; 06/23/2016 10:19 AM) Arthritis  Asthma  Back Pain  Chest pain  Cholelithiasis  High blood pressure     Review of Systems Malachy Moan RMA; 06/23/2016 10:19 AM) General Present- Appetite Loss and Fatigue. Not Present- Chills, Fever, Night Sweats, Weight Gain and Weight Loss. HEENT Present- Visual Disturbances and Wears glasses/contact lenses. Not Present- Earache, Hearing Loss, Hoarseness, Nose Bleed, Oral Ulcers, Ringing in the Ears, Seasonal Allergies, Sinus Pain, Sore Throat and Yellow Eyes. Respiratory Present- Chronic Cough. Not Present- Bloody sputum, Difficulty Breathing, Snoring and Wheezing. Breast Not Present- Breast Mass, Breast Pain, Nipple Discharge and Skin Changes. Cardiovascular Present- Palpitations and Shortness of Breath. Not Present- Chest Pain, Difficulty Breathing Lying Down, Leg Cramps, Rapid Heart Rate and Swelling of Extremities. Gastrointestinal Present- Abdominal Pain, Bloating, Bloody Stool, Gets full quickly at meals, Hemorrhoids and Nausea. Not Present- Change in Bowel Habits, Chronic diarrhea, Constipation, Difficulty Swallowing, Excessive gas, Indigestion, Rectal Pain and Vomiting. Musculoskeletal Present- Back Pain, Joint Pain and Muscle Weakness. Not Present- Joint Stiffness, Muscle Pain and Swelling of Extremities. Neurological Present- Headaches and Numbness. Not Present- Decreased Memory, Fainting, Seizures, Tingling, Tremor, Trouble walking and Weakness. Psychiatric Not Present- Anxiety, Bipolar, Change in Sleep Pattern, Depression, Fearful and Frequent crying. Endocrine Present- Hair Changes and Heat Intolerance. Not Present- Cold Intolerance, Excessive Hunger, Hot flashes and New Diabetes. Hematology Not Present- Blood Thinners, Easy Bruising, Excessive bleeding, Gland problems, HIV and Persistent Infections.  Vitals Malachy Moan RMA; 06/23/2016 10:23  AM) 06/23/2016 10:22 AM Weight: 201.6 lb Height: 64in Body Surface Area: 1.96 m Body Mass Index: 34.6 kg/m  Temp.: 98.59F  Pulse: 90 (Regular)  BP: 152/80 (Sitting, Left Arm, Standard) Physical Exam Rolm Bookbinder MD; 06/23/2016 1:35 PM) General Mental Status-Alert. Orientation-Oriented X3. Eye Sclera/Conjunctiva - Bilateral-No scleral icterus. Chest and Lung Exam Chest and lung exam reveals -on auscultation, normal breath sounds, no adventitious sounds and normal vocal resonance. Cardiovascular Cardiovascular examination reveals -normal heart sounds, regular rate and rhythm with no murmurs. Abdomen Note: soft well healed lap scars tender rlq to palpation Lymphatic Head & Neck General Head & Neck Lymphatics: Bilateral - Description - Normal.    Assessment & Plan Rolm Bookbinder MD; 06/23/2016 1:52 PM) MASS OF COLON (K63.9) Story: Laparoscopic assisted right colectomy we discussed staging and pathophysiology of colon cancer. her biopsy is tva with hg dysplasia with focus suspicious for carcinoma. I think she has colon cancer. we discussed this today. we discussed laparoscopic assisted right colectomy today. we discussed surgery, recovery and risks. risks include but not limited to bleeding, infection, abscess, leak, hernia, reoperation. we discussed if she does have colon cancer may have further therapy indicated based on pathology results and I would refer to oncology postop. I think she needs surgery asap.

## 2016-07-03 NOTE — Anesthesia Postprocedure Evaluation (Signed)
Anesthesia Post Note  Patient: Becky Brooks  Procedure(s) Performed: Procedure(s) (LRB): LAPAROSCOPIC ASSISTED RIGHT COLECTOMY (Right)  Patient location during evaluation: PACU Anesthesia Type: General Level of consciousness: awake Pain management: pain level controlled Vital Signs Assessment: post-procedure vital signs reviewed and stable Respiratory status: spontaneous breathing Cardiovascular status: stable Anesthetic complications: no       Last Vitals:  Vitals:   07/03/16 1539 07/03/16 1606  BP: (!) 147/64 (!) 140/57  Pulse: 82 81  Resp: 13 14  Temp:  36.7 C    Last Pain:  Vitals:   07/03/16 1606  TempSrc: Oral  PainSc:                  Becky Brooks

## 2016-07-03 NOTE — Transfer of Care (Signed)
Immediate Anesthesia Transfer of Care Note  Patient: Becky Brooks  Procedure(s) Performed: Procedure(s): LAPAROSCOPIC ASSISTED RIGHT COLECTOMY (Right)  Patient Location: PACU  Anesthesia Type:General  Level of Consciousness: oriented and patient cooperative, drowsy   Airway & Oxygen Therapy: Patient Spontanous Breathing and Patient connected to nasal cannula oxygen  Post-op Assessment: Report given to RN and Post -op Vital signs reviewed and stable  Post vital signs: Reviewed and stable  Last Vitals:  Vitals:   07/03/16 1145 07/03/16 1151  BP: (!) 151/89   Pulse: 93   Resp: 18   Temp:  37.1 C    Last Pain:  Vitals:   07/03/16 1151  TempSrc: Oral      Patients Stated Pain Goal: 3 ([id number])  Complications: No apparent anesthesia complications

## 2016-07-03 NOTE — Anesthesia Procedure Notes (Signed)
Procedure Name: Intubation Date/Time: 07/03/2016 12:56 PM Performed by: Everlean Cherry A Pre-anesthesia Checklist: Patient identified, Emergency Drugs available, Suction available and Patient being monitored Patient Re-evaluated:Patient Re-evaluated prior to inductionOxygen Delivery Method: Circle system utilized Preoxygenation: Pre-oxygenation with 100% oxygen Intubation Type: IV induction Ventilation: Mask ventilation without difficulty and Oral airway inserted - appropriate to patient size Laryngoscope Size: Sabra Heck and 2 Grade View: Grade II Tube type: Oral Tube size: 7.0 mm Number of attempts: 1 Airway Equipment and Method: Stylet Placement Confirmation: positive ETCO2,  breath sounds checked- equal and bilateral and ETT inserted through vocal cords under direct vision Secured at: 24 cm Tube secured with: Tape Dental Injury: Teeth and Oropharynx as per pre-operative assessment

## 2016-07-04 ENCOUNTER — Encounter (HOSPITAL_COMMUNITY): Payer: Self-pay | Admitting: General Surgery

## 2016-07-04 LAB — CBC
HCT: 27.6 % — ABNORMAL LOW (ref 36.0–46.0)
Hemoglobin: 7.9 g/dL — ABNORMAL LOW (ref 12.0–15.0)
MCH: 21.4 pg — ABNORMAL LOW (ref 26.0–34.0)
MCHC: 28.6 g/dL — ABNORMAL LOW (ref 30.0–36.0)
MCV: 74.6 fL — ABNORMAL LOW (ref 78.0–100.0)
PLATELETS: 250 10*3/uL (ref 150–400)
RBC: 3.7 MIL/uL — AB (ref 3.87–5.11)
RDW: 20.4 % — AB (ref 11.5–15.5)
WBC: 7.2 10*3/uL (ref 4.0–10.5)

## 2016-07-04 LAB — BASIC METABOLIC PANEL
Anion gap: 7 (ref 5–15)
BUN: 7 mg/dL (ref 6–20)
CHLORIDE: 106 mmol/L (ref 101–111)
CO2: 24 mmol/L (ref 22–32)
Calcium: 9.2 mg/dL (ref 8.9–10.3)
Creatinine, Ser: 0.56 mg/dL (ref 0.44–1.00)
Glucose, Bld: 102 mg/dL — ABNORMAL HIGH (ref 65–99)
POTASSIUM: 3.9 mmol/L (ref 3.5–5.1)
SODIUM: 137 mmol/L (ref 135–145)

## 2016-07-04 MED ORDER — DIPHENHYDRAMINE HCL 50 MG/ML IJ SOLN: 12.5000 mg | Freq: Four times a day (QID) | INTRAMUSCULAR | Status: DC | PRN

## 2016-07-04 MED ORDER — ONDANSETRON HCL 4 MG/2ML IJ SOLN
4.0000 mg | Freq: Four times a day (QID) | INTRAMUSCULAR | Status: DC | PRN
  Administered 2016-07-04 – 2016-07-05 (×4): 4 mg via INTRAVENOUS
  Filled 2016-07-04 (×5): qty 2

## 2016-07-04 MED ORDER — SODIUM CHLORIDE 0.9% FLUSH: 9.0000 mL | INTRAVENOUS | Status: DC | PRN

## 2016-07-04 MED ORDER — NALOXONE HCL 0.4 MG/ML IJ SOLN: 0.4000 mg | INTRAMUSCULAR | Status: DC | PRN

## 2016-07-04 MED ORDER — MORPHINE SULFATE 2 MG/ML IV SOLN
INTRAVENOUS | Status: DC
  Administered 2016-07-04: 11:00:00 via INTRAVENOUS
  Administered 2016-07-04: 8 mg via INTRAVENOUS
  Administered 2016-07-04: 22:00:00 via INTRAVENOUS
  Administered 2016-07-04: 24 mg via INTRAVENOUS
  Administered 2016-07-05: 3 mg via INTRAVENOUS
  Administered 2016-07-05: 2 mg via INTRAVENOUS
  Administered 2016-07-05: 12 mg via INTRAVENOUS
  Administered 2016-07-05: 14 mg via INTRAVENOUS
  Administered 2016-07-05: 13 mg via INTRAVENOUS
  Administered 2016-07-06: 5 mg via INTRAVENOUS
  Administered 2016-07-06: 18:00:00 via INTRAVENOUS
  Administered 2016-07-06: 7 mg via INTRAVENOUS
  Administered 2016-07-06: 1 mg via INTRAVENOUS
  Administered 2016-07-06: 2 mg via INTRAVENOUS
  Administered 2016-07-07: 9 mg via INTRAVENOUS
  Administered 2016-07-07: 4 mg via INTRAVENOUS
  Administered 2016-07-07: 3 mg via INTRAVENOUS
  Filled 2016-07-04 (×3): qty 25

## 2016-07-04 MED ORDER — DIPHENHYDRAMINE HCL 12.5 MG/5ML PO ELIX: 12.5000 mg | ORAL_SOLUTION | Freq: Four times a day (QID) | ORAL | Status: DC | PRN

## 2016-07-04 NOTE — Progress Notes (Signed)
1 Day Post-Op  Subjective: Complains of pain abdomen, no n/v, no flatus, pain not controlled well  Objective: Vital signs in last 24 hours: Temp:  [98 F (36.7 C)-98.9 F (37.2 C)] 98.1 F (36.7 C) (01/19 0535) Pulse Rate:  [81-93] 88 (01/19 0535) Resp:  [13-18] 18 (01/19 0535) BP: (130-151)/(56-89) 130/65 (01/19 0535) SpO2:  [95 %-100 %] 99 % (01/19 0535) Weight:  [95.2 kg (209 lb 14.1 oz)-95.7 kg (211 lb)] 95.2 kg (209 lb 14.1 oz) (01/18 1606) Last BM Date: 07/03/16  Intake/Output from previous day: 01/18 0701 - 01/19 0700 In: 3223.8 [P.O.:300; I.V.:2313.8; IV Piggyback:610] Out: 695 [Urine:645; Blood:50] Intake/Output this shift: No intake/output data recorded.  Resp: clear to auscultation bilaterally Cardio: regular rate and rhythm GI: approp tender soft nd few bs incisions clean  Lab Results:   Recent Labs  07/04/16 0638  WBC 7.2  HGB 7.9*  HCT 27.6*  PLT 250   BMET  Recent Labs  07/04/16 0638  NA 137  K 3.9  CL 106  CO2 24  GLUCOSE 102*  BUN 7  CREATININE 0.56  CALCIUM 9.2   PT/INR No results for input(s): LABPROT, INR in the last 72 hours. ABG No results for input(s): PHART, HCO3 in the last 72 hours.  Invalid input(s): PCO2, PO2  Studies/Results: No results found.  Anti-infectives: Anti-infectives    Start     Dose/Rate Route Frequency Ordered Stop   07/03/16 1152  cefoTEtan in Dextrose 5% (CEFOTAN) 2-2.08 GM-% IVPB    Comments:  Merryl Hacker   : cabinet override      07/03/16 1152 07/03/16 2359   07/03/16 1146  cefoTEtan (CEFOTAN) 2 g in dextrose 5 % 50 mL IVPB     2 g 100 mL/hr over 30 Minutes Intravenous On call to O.R. 07/03/16 1146 07/03/16 1329   07/03/16 1146  neomycin (MYCIFRADIN) tablet 1,000 mg  Status:  Discontinued     1,000 mg Oral 3 times per day 07/03/16 1146 07/03/16 1603   07/03/16 1146  metroNIDAZOLE (FLAGYL) tablet 1,000 mg  Status:  Discontinued     1,000 mg Oral 3 times per day 07/03/16 1146 07/03/16 1603       Assessment/Plan: POD 1 lap right colon  1. Change to pca, pain not controlled and doesn't want to move 2. Clear liquids 3. pulm toilet, oob 4. Abl anemia stabl 5. Lovenox, scds 6. Await path 7. Foley out this am  [facility] 07/04/2016

## 2016-07-05 MED ORDER — KETOROLAC TROMETHAMINE 30 MG/ML IJ SOLN
30.0000 mg | Freq: Three times a day (TID) | INTRAMUSCULAR | Status: DC | PRN
  Administered 2016-07-05: 30 mg via INTRAVENOUS
  Filled 2016-07-05: qty 1

## 2016-07-05 NOTE — Progress Notes (Signed)
2 Days Post-Op  Subjective: Still having a lot of abdominal pain. No fever and wbc nml  Objective: Vital signs in last 24 hours: Temp:  [98.2 F (36.8 C)-99.1 F (37.3 C)] 99.1 F (37.3 C) (01/20 0432) Pulse Rate:  [87-104] 94 (01/20 0432) Resp:  [15-22] 21 (01/20 0432) BP: (135-167)/(58-70) 141/58 (01/20 0432) SpO2:  [93 %-98 %] 96 % (01/20 0432) Last BM Date: 07/03/16  Intake/Output from previous day: 01/19 0701 - 01/20 0700 In: 2421.3 [P.O.:700; I.V.:1721.3] Out: 1100 [Urine:1100] Intake/Output this shift: Total I/O In: 1721.3 [I.V.:1721.3] Out: 900 [Urine:900]  Resp: clear to auscultation bilaterally Cardio: regular rate and rhythm GI: soft, diffusely tender. incision ok. few bs  Lab Results:   Recent Labs  07/04/16 0638  WBC 7.2  HGB 7.9*  HCT 27.6*  PLT 250   BMET  Recent Labs  07/04/16 0638  NA 137  K 3.9  CL 106  CO2 24  GLUCOSE 102*  BUN 7  CREATININE 0.56  CALCIUM 9.2   PT/INR No results for input(s): LABPROT, INR in the last 72 hours. ABG No results for input(s): PHART, HCO3 in the last 72 hours.  Invalid input(s): PCO2, PO2  Studies/Results: No results found.  Anti-infectives: Anti-infectives    Start     Dose/Rate Route Frequency Ordered Stop   07/03/16 1152  cefoTEtan in Dextrose 5% (CEFOTAN) 2-2.08 GM-% IVPB    Comments:  Merryl Hacker   : cabinet override      07/03/16 1152 07/03/16 2359   07/03/16 1146  cefoTEtan (CEFOTAN) 2 g in dextrose 5 % 50 mL IVPB     2 g 100 mL/hr over 30 Minutes Intravenous On call to O.R. 07/03/16 1146 07/03/16 1329   07/03/16 1146  neomycin (MYCIFRADIN) tablet 1,000 mg  Status:  Discontinued     1,000 mg Oral 3 times per day 07/03/16 1146 07/03/16 1603   07/03/16 1146  metroNIDAZOLE (FLAGYL) tablet 1,000 mg  Status:  Discontinued     1,000 mg Oral 3 times per day 07/03/16 1146 07/03/16 1603      Assessment/Plan: s/p Procedure(s): LAPAROSCOPIC ASSISTED RIGHT COLECTOMY (Right) stay with  clears until bowel function returns.  Continue entereg Continue pca for pain. Add toradol OOB POD 2  LOS: 2 days    TOTH Brooks,Becky Guevarra S 07/05/2016

## 2016-07-05 NOTE — Progress Notes (Signed)
MD paged and message left with answering service that pt had an episode of coffee ground vomiting

## 2016-07-05 NOTE — Progress Notes (Signed)
Verbal order given to make pt npo and monitor nausea and vomiting,care order to place NGT if pt vomits again given.

## 2016-07-05 NOTE — Progress Notes (Signed)
Pt vomited 500cc of brown colored emesis this evening

## 2016-07-06 MED ORDER — ACETAMINOPHEN 10 MG/ML IV SOLN
1000.0000 mg | Freq: Four times a day (QID) | INTRAVENOUS | Status: AC
  Administered 2016-07-06 – 2016-07-07 (×4): 1000 mg via INTRAVENOUS
  Filled 2016-07-06 (×4): qty 100

## 2016-07-06 MED ORDER — KCL IN DEXTROSE-NACL 20-5-0.45 MEQ/L-%-% IV SOLN
INTRAVENOUS | Status: DC
  Administered 2016-07-06: 14:00:00 via INTRAVENOUS
  Administered 2016-07-07: 1 mL via INTRAVENOUS
  Administered 2016-07-08 – 2016-07-11 (×5): via INTRAVENOUS
  Filled 2016-07-06 (×7): qty 1000

## 2016-07-06 NOTE — Progress Notes (Signed)
Patient ID: Becky Brooks, female   DOB: 02/24/1956, 61 y.o.   MRN: [id number] Doing as expected, not surprised with ileus given preop partial obstruction by tumor, encouraged oob today, will stop toradol with anastomosis and give 24 hours ofirmev.  Abdomen softer than a couple days ago, pain better controlled, path pending

## 2016-07-06 NOTE — Progress Notes (Signed)
3 Days Post-Op  Subjective: Feels better than yesterday. Less tender. Had a bm and emesis  Objective: Vital signs in last 24 hours: Temp:  [98.1 F (36.7 C)-98.9 F (37.2 C)] 98.3 F (36.8 C) (01/21 0430) Pulse Rate:  [77-90] 86 (01/21 0430) Resp:  [13-19] 19 (01/21 0443) BP: (111-152)/(56-71) 147/62 (01/21 0430) SpO2:  [93 %-98 %] 96 % (01/21 0443) Last BM Date: 07/03/16  Intake/Output from previous day: 01/20 0701 - 01/21 0700 In: 990 [P.O.:240; I.V.:750] Out: 1000 [Urine:500; Emesis/NG output:500] Intake/Output this shift: No intake/output data recorded.  Resp: clear to auscultation bilaterally Cardio: regular rate and rhythm GI: softer and less tender than yesterday. quiet  Lab Results:   Recent Labs  07/04/16 0638  WBC 7.2  HGB 7.9*  HCT 27.6*  PLT 250   BMET  Recent Labs  07/04/16 0638  NA 137  K 3.9  CL 106  CO2 24  GLUCOSE 102*  BUN 7  CREATININE 0.56  CALCIUM 9.2   PT/INR No results for input(s): LABPROT, INR in the last 72 hours. ABG No results for input(s): PHART, HCO3 in the last 72 hours.  Invalid input(s): PCO2, PO2  Studies/Results: No results found.  Anti-infectives: Anti-infectives    Start     Dose/Rate Route Frequency Ordered Stop   07/03/16 1152  cefoTEtan in Dextrose 5% (CEFOTAN) 2-2.08 GM-% IVPB    Comments:  Merryl Hacker   : cabinet override      07/03/16 1152 07/03/16 2359   07/03/16 1146  cefoTEtan (CEFOTAN) 2 g in dextrose 5 % 50 mL IVPB     2 g 100 mL/hr over 30 Minutes Intravenous On call to O.R. 07/03/16 1146 07/03/16 1329   07/03/16 1146  neomycin (MYCIFRADIN) tablet 1,000 mg  Status:  Discontinued     1,000 mg Oral 3 times per day 07/03/16 1146 07/03/16 1603   07/03/16 1146  metroNIDAZOLE (FLAGYL) tablet 1,000 mg  Status:  Discontinued     1,000 mg Oral 3 times per day 07/03/16 1146 07/03/16 1603      Assessment/Plan: s/p Procedure(s): LAPAROSCOPIC ASSISTED RIGHT COLECTOMY (Right) will allow ice chips  today  ambulate  LOS: 3 days    TOTH III,PAUL S 07/06/2016

## 2016-07-06 NOTE — Progress Notes (Signed)
Pt refusing NGT at this time.

## 2016-07-07 ENCOUNTER — Inpatient Hospital Stay (HOSPITAL_COMMUNITY): Payer: BC Managed Care – PPO

## 2016-07-07 LAB — CBC
HCT: 26.4 % — ABNORMAL LOW (ref 36.0–46.0)
HEMOGLOBIN: 7.5 g/dL — AB (ref 12.0–15.0)
MCH: 21.1 pg — AB (ref 26.0–34.0)
MCHC: 28.4 g/dL — ABNORMAL LOW (ref 30.0–36.0)
MCV: 74.4 fL — ABNORMAL LOW (ref 78.0–100.0)
PLATELETS: 335 10*3/uL (ref 150–400)
RBC: 3.55 MIL/uL — AB (ref 3.87–5.11)
RDW: 19.9 % — ABNORMAL HIGH (ref 11.5–15.5)
WBC: 10.7 10*3/uL — AB (ref 4.0–10.5)

## 2016-07-07 LAB — BASIC METABOLIC PANEL
ANION GAP: 6 (ref 5–15)
BUN: 10 mg/dL (ref 6–20)
CALCIUM: 9.4 mg/dL (ref 8.9–10.3)
CHLORIDE: 107 mmol/L (ref 101–111)
CO2: 26 mmol/L (ref 22–32)
Creatinine, Ser: 0.54 mg/dL (ref 0.44–1.00)
GFR calc non Af Amer: >60 mL/min (ref >60)
Glucose, Bld: 109 mg/dL — ABNORMAL HIGH (ref 65–99)
Potassium: 3.9 mmol/L (ref 3.5–5.1)
SODIUM: 139 mmol/L (ref 135–145)

## 2016-07-07 MED ORDER — SODIUM CHLORIDE 0.9 % IV BOLUS (SEPSIS)
500.0000 mL | Freq: Once | INTRAVENOUS | Status: AC
  Administered 2016-07-07: 500 mL via INTRAVENOUS

## 2016-07-07 NOTE — Progress Notes (Signed)
Order to place NGT.  Pt refusing placement of NGT.  Spoke with PA regarding pt's stance on NGT.  PA assessed pt and stated that it was fine to not place NGT.  Will continue to monitor.

## 2016-07-07 NOTE — Progress Notes (Signed)
4 Days Post-Op  Subjective: Minimal flatus, one episode emesis, feels nauseated, has been up, voiding  Objective: Vital signs in last 24 hours: Temp:  [98 F (36.7 C)-98.3 F (36.8 C)] 98.3 F (36.8 C) (01/22 0620) Pulse Rate:  [70-102] 70 (01/22 0620) Resp:  [12-19] 14 (01/22 0400) BP: (133-151)/(59-64) 145/62 (01/22 0620) SpO2:  [91 %-99 %] 99 % (01/22 0620) Last BM Date: 07/05/16  Intake/Output from previous day: 01/21 0701 - 01/22 0700 In: 1613.8 [I.V.:1213.8; IV Piggyback:400] Out: 950 [Urine:950] Intake/Output this shift: No intake/output data recorded.  General appearance: no distress Resp: decreased bilateral bases Cardio: regular rate and rhythm GI: moderate distended no bs approp tender incisions clean  Lab Results:   Recent Labs  07/07/16 0655  WBC 10.7*  HGB 7.5*  HCT 26.4*  PLT 335   BMET  Recent Labs  07/07/16 0655  NA 139  K 3.9  CL 107  CO2 26  GLUCOSE 109*  BUN 10  CREATININE 0.54  CALCIUM 9.4   PT/INR No results for input(s): LABPROT, INR in the last 72 hours. ABG No results for input(s): PHART, HCO3 in the last 72 hours.  Invalid input(s): PCO2, PO2  Studies/Results: No results found.  Anti-infectives: Anti-infectives    Start     Dose/Rate Route Frequency Ordered Stop   07/03/16 1152  cefoTEtan in Dextrose 5% (CEFOTAN) 2-2.08 GM-% IVPB    Comments:  Merryl Hacker   : cabinet override      07/03/16 1152 07/03/16 2359   07/03/16 1146  cefoTEtan (CEFOTAN) 2 g in dextrose 5 % 50 mL IVPB     2 g 100 mL/hr over 30 Minutes Intravenous On call to O.R. 07/03/16 1146 07/03/16 1329   07/03/16 1146  neomycin (MYCIFRADIN) tablet 1,000 mg  Status:  Discontinued     1,000 mg Oral 3 times per day 07/03/16 1146 07/03/16 1603   07/03/16 1146  metroNIDAZOLE (FLAGYL) tablet 1,000 mg  Status:  Discontinued     1,000 mg Oral 3 times per day 07/03/16 1146 07/03/16 1603      Assessment/Plan: POD 2 lap right colon  1. Continue pca  today 2. Npo due to ileus, will check film, I think needs ng tube placed today and we discussed this 3. pulm toilet, oob 4. Abl anemia stable 5. Lovenox, scds 6. Await path   [facility] 07/07/2016

## 2016-07-08 MED ORDER — OXYCODONE HCL 5 MG PO TABS
10.0000 mg | ORAL_TABLET | ORAL | Status: DC | PRN
  Administered 2016-07-08 (×2): 10 mg via ORAL
  Filled 2016-07-08 (×2): qty 2

## 2016-07-08 MED ORDER — MORPHINE SULFATE (PF) 2 MG/ML IV SOLN
2.0000 mg | INTRAVENOUS | Status: DC | PRN
  Administered 2016-07-08 – 2016-07-11 (×5): 2 mg via INTRAVENOUS
  Filled 2016-07-08 (×5): qty 1

## 2016-07-08 NOTE — Evaluation (Signed)
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 Pt underwent laparoscopic assisted right colectomy on 1/18. PMH - obesity,  htn, arthritis  Clinical Impression  Pt admitted with above diagnosis and presents to PT with functional limitations due to deficits listed below (See PT problem list). Pt needs skilled PT to maximize independence and safety to allow discharge to home. Expect pt will make steady progress as pain improves.      Follow Up Recommendations No PT follow up    Equipment Recommendations  None recommended by PT    Recommendations for Other Services       Precautions / Restrictions Precautions Precautions: None Restrictions Weight Bearing Restrictions: No      Mobility  Bed Mobility Overal bed mobility: Needs Assistance Bed Mobility: Sidelying to Sit;Sit to Supine   Sidelying to sit: Min assist;HOB elevated   Sit to supine: Min guard;HOB elevated   General bed mobility comments: Assist to elevate trunk into sitting  Transfers Overall transfer level: Needs assistance Equipment used: None Transfers: Sit to/from Stand Sit to Stand: Min guard         General transfer comment: for safety  Ambulation/Gait Ambulation/Gait assistance: Min guard Ambulation Distance (Feet): 170 Feet Assistive device: Rolling walker (2 wheeled) Gait Pattern/deviations: Step-through pattern;Decreased stride length;Trunk flexed Gait velocity: decr Gait velocity interpretation: Below normal speed for age/gender General Gait Details: Pt with guarded gait due to pain.  Periodicall would stop to stand more erect and to let pain decr  Stairs            Wheelchair Mobility    Modified Rankin (Stroke Patients Only)       Balance Overall balance assessment: No apparent balance deficits (not formally  assessed)                                           Pertinent Vitals/Pain Pain Assessment: 0-10 Pain Score: 6  Pain Location: abdomen Pain Descriptors / Indicators: Squeezing Pain Intervention(s): Limited activity within patient's tolerance;Premedicated before session    Home Living Family/patient expects to be discharged to:: Private residence Living Arrangements: Children Available Help at Discharge: Family;Available PRN/intermittently Type of Home: House Home Access: Stairs to enter Entrance Stairs-Rails: Right Entrance Stairs-Number of Steps: 5 Home Layout: One level Home Equipment: None      Prior Function Level of Independence: Independent         Comments: had been fatigued over past months due to anemia     Hand Dominance        Extremity/Trunk Assessment   Upper Extremity Assessment Upper Extremity Assessment: Overall WFL for tasks assessed    Lower Extremity Assessment Lower Extremity Assessment: Overall WFL for tasks assessed       Communication   Communication: No difficulties  Cognition Arousal/Alertness: Awake/alert Behavior During Therapy: WFL for tasks assessed/performed Overall Cognitive Status: Within Functional Limits for tasks assessed                      General Comments      Exercises     Assessment/Plan    PT Assessment Patient needs continued PT services  PT Problem List Decreased activity tolerance;Decreased mobility;Pain  PT Treatment Interventions DME instruction;Stair training;Gait training;Functional mobility training;Therapeutic activities;Therapeutic exercise;Patient/family education    PT Goals (Current goals can be found in the Care Plan section)  Acute Rehab PT Goals Patient Stated Goal: return home PT Goal Formulation: With patient Time For Goal Achievement: 07/15/16 Potential to Achieve Goals: Good    Frequency Min 3X/week   Barriers to discharge Inaccessible home  environment;Decreased caregiver support stairs into home and alone at times    Co-evaluation               End of Session   Activity Tolerance: Patient limited by pain Patient left: in bed;with call bell/phone within reach Nurse Communication: Mobility status         Time: [id number] PT Time Calculation (min) (ACUTE ONLY): 18 min   Charges:   PT Evaluation $PT Eval Moderate Complexity: 1 Procedure     PT G CodesShary Decamp [place] 2016/07/11, 3:20 PM  [employer] PT [phone]

## 2016-07-08 NOTE — Progress Notes (Signed)
5 Days Post-Op  Subjective: No more n/v, having flatus, liquid bm, feels better, up in chair  Objective: Vital signs in last 24 hours: Temp:  [97.9 F (36.6 C)-99.4 F (37.4 C)] 98.5 F (36.9 C) (01/23 0626) Pulse Rate:  [68-79] 79 (01/23 0626) Resp:  [16-19] 16 (01/23 0626) BP: (129-154)/(56-66) 141/66 (01/23 0626) SpO2:  [95 %-100 %] 97 % (01/23 0626) Last BM Date: 07/05/16  Intake/Output from previous day: 01/22 0701 - 01/23 0700 In: 1712.5 [I.V.:1712.5] Out: 1125 [Urine:1125] Intake/Output this shift: No intake/output data recorded.  General appearance: no distress Resp: clear to auscultation bilaterally Cardio: regular rate and rhythm GI: soft approp tender bs are present today incisions clean  Lab Results:   Recent Labs  07/07/16 0655  WBC 10.7*  HGB 7.5*  HCT 26.4*  PLT 335   BMET  Recent Labs  07/07/16 0655  NA 139  K 3.9  CL 107  CO2 26  GLUCOSE 109*  BUN 10  CREATININE 0.54  CALCIUM 9.4   PT/INR No results for input(s): LABPROT, INR in the last 72 hours. ABG No results for input(s): PHART, HCO3 in the last 72 hours.  Invalid input(s): PCO2, PO2  Studies/Results: Dg Abd 1 View  Result Date: 07/07/2016 CLINICAL DATA:  Abdominal pain.  Recent partial colectomy EXAM: ABDOMEN - 1 VIEW COMPARISON:  CT abdomen and pelvis June 03, 2016 FINDINGS: There are several loops of dilated bowel without appreciable air-fluid level. No free air. There are surgical clips in the right upper quadrant. There are skin staples slightly to the left of midline in the abdomen as well as overlying portions of the lower pelvis. There is mild bibasilar lung atelectatic change. IMPRESSION: Loops of dilated small bowel without appreciable air-fluid levels. Suspect a degree of ileus or enteritis. Bowel obstruction felt to be less likely. No free air. Mild bibasilar atelectasis. Electronically Signed   By: Lowella Grip III M.D.   On: 07/07/2016 09:29     Anti-infectives: Anti-infectives    Start     Dose/Rate Route Frequency Ordered Stop   07/03/16 1152  cefoTEtan in Dextrose 5% (CEFOTAN) 2-2.08 GM-% IVPB    Comments:  Merryl Hacker   : cabinet override      07/03/16 1152 07/03/16 2359   07/03/16 1146  cefoTEtan (CEFOTAN) 2 g in dextrose 5 % 50 mL IVPB     2 g 100 mL/hr over 30 Minutes Intravenous On call to O.R. 07/03/16 1146 07/03/16 1329   07/03/16 1146  neomycin (MYCIFRADIN) tablet 1,000 mg  Status:  Discontinued     1,000 mg Oral 3 times per day 07/03/16 1146 07/03/16 1603   07/03/16 1146  metroNIDAZOLE (FLAGYL) tablet 1,000 mg  Status:  Discontinued     1,000 mg Oral 3 times per day 07/03/16 1146 07/03/16 1603      Assessment/Plan: POD 5 lap right colon  1. Dc pca, oral narcotics with iv backup 2. Will give clears today as has return of bowel function 3. pulm toilet, oob 4. Lovenox, scds 5. Await path  [facility] 07/08/2016

## 2016-07-08 NOTE — Progress Notes (Signed)
Wasted 8.8 mL of Morphine PCA in sink.  Witnessed by Mayra Neer, RN.

## 2016-07-09 ENCOUNTER — Inpatient Hospital Stay (HOSPITAL_COMMUNITY): Payer: BC Managed Care – PPO

## 2016-07-09 NOTE — Progress Notes (Signed)
Patient refused NG insertion at this time.

## 2016-07-09 NOTE — Progress Notes (Signed)
Patient vomited 200cc brown emesis. Nausea medicine given

## 2016-07-09 NOTE — Progress Notes (Signed)
6 Days Post-Op  Subjective: Passing flatus, having bms pain controlled, no n/v, tired  Objective: Vital signs in last 24 hours: Temp:  [98.2 F (36.8 C)-98.7 F (37.1 C)] 98.5 F (36.9 C) (01/24 0406) Pulse Rate:  [70-83] 83 (01/24 0406) Resp:  [16-18] 16 (01/24 0406) BP: (130-147)/(60-81) 140/72 (01/24 0406) SpO2:  [95 %-97 %] 95 % (01/24 0406) Last BM Date: 07/07/16  Intake/Output from previous day: 01/23 0701 - 01/24 0700 In: 2275 [P.O.:1640; I.V.:635] Out: 2600 [Urine:2150; Emesis/NG output:450] Intake/Output this shift: Total I/O In: 120 [P.O.:120] Out: 200 [Urine:200]  General appearance: no distress Resp: clear to auscultation bilaterally Cardio: regular rate and rhythm GI: soft bs present approp tender wounds clean  Lab Results:   Recent Labs  07/07/16 0655  WBC 10.7*  HGB 7.5*  HCT 26.4*  PLT 335   BMET  Recent Labs  07/07/16 0655  NA 139  K 3.9  CL 107  CO2 26  GLUCOSE 109*  BUN 10  CREATININE 0.54  CALCIUM 9.4   PT/INR No results for input(s): LABPROT, INR in the last 72 hours. ABG No results for input(s): PHART, HCO3 in the last 72 hours.  Invalid input(s): PCO2, PO2  Studies/Results: No results found.  Anti-infectives: Anti-infectives    Start     Dose/Rate Route Frequency Ordered Stop   07/03/16 1152  cefoTEtan in Dextrose 5% (CEFOTAN) 2-2.08 GM-% IVPB    Comments:  Merryl Hacker   : cabinet override      07/03/16 1152 07/03/16 2359   07/03/16 1146  cefoTEtan (CEFOTAN) 2 g in dextrose 5 % 50 mL IVPB     2 g 100 mL/hr over 30 Minutes Intravenous On call to O.R. 07/03/16 1146 07/03/16 1329   07/03/16 1146  neomycin (MYCIFRADIN) tablet 1,000 mg  Status:  Discontinued     1,000 mg Oral 3 times per day 07/03/16 1146 07/03/16 1603   07/03/16 1146  metroNIDAZOLE (FLAGYL) tablet 1,000 mg  Status:  Discontinued     1,000 mg Oral 3 times per day 07/03/16 1146 07/03/16 1603      Assessment/Plan: POD 6lap right colon  1.  Continue oral narcotics 2. Advance diet to fulls and as tolerated 3. pulm toilet, oob 4. Lovenox, scds 5. Discussed path this am, will refer to oncology as outpatient 6. Hopefully home in am  [facility] 07/09/2016

## 2016-07-09 NOTE — Progress Notes (Signed)
Dr. Renne Crigler notified patient still having emesis  Orders received.

## 2016-07-09 NOTE — Progress Notes (Signed)
Reviewed KUB--some gas in the colon, but nothing markedly distended with gas.  May have some fluid filled loops.  No ominous signs.  Becky Brooks. Dahlia Bailiff, MD, [place] [phone] [facility]

## 2016-07-10 LAB — BASIC METABOLIC PANEL
ANION GAP: 9 (ref 5–15)
BUN: 7 mg/dL (ref 6–20)
CO2: 24 mmol/L (ref 22–32)
Calcium: 9.7 mg/dL (ref 8.9–10.3)
Chloride: 105 mmol/L (ref 101–111)
Creatinine, Ser: 0.53 mg/dL (ref 0.44–1.00)
GFR calc non Af Amer: >60 mL/min (ref >60)
Glucose, Bld: 118 mg/dL — ABNORMAL HIGH (ref 65–99)
Potassium: 3.6 mmol/L (ref 3.5–5.1)
SODIUM: 138 mmol/L (ref 135–145)

## 2016-07-10 LAB — CBC
HEMATOCRIT: 26.6 % — AB (ref 36.0–46.0)
HEMOGLOBIN: 7.8 g/dL — AB (ref 12.0–15.0)
MCH: 21.1 pg — ABNORMAL LOW (ref 26.0–34.0)
MCHC: 29.3 g/dL — ABNORMAL LOW (ref 30.0–36.0)
MCV: 71.9 fL — ABNORMAL LOW (ref 78.0–100.0)
Platelets: 346 10*3/uL (ref 150–400)
RBC: 3.7 MIL/uL — ABNORMAL LOW (ref 3.87–5.11)
RDW: 19.5 % — AB (ref 11.5–15.5)
WBC: 7.7 10*3/uL (ref 4.0–10.5)

## 2016-07-10 MED ORDER — METOCLOPRAMIDE HCL 5 MG/ML IJ SOLN
10.0000 mg | Freq: Once | INTRAMUSCULAR | Status: AC
Start: 2016-07-10 — End: 2016-07-10
  Administered 2016-07-10: 10 mg via INTRAVENOUS
  Filled 2016-07-10: qty 2

## 2016-07-10 MED ORDER — ONDANSETRON HCL 4 MG/2ML IJ SOLN
4.0000 mg | Freq: Three times a day (TID) | INTRAMUSCULAR | Status: DC
  Administered 2016-07-10 – 2016-07-11 (×5): 4 mg via INTRAVENOUS
  Filled 2016-07-10 (×5): qty 2

## 2016-07-10 NOTE — Progress Notes (Signed)
Physical Therapy Treatment Patient Details Name: Becky Brooks MRN: [id number] DOB: March 08, 1956 Today's Date: 07/10/2016    History of Present Illness Pt adm with partially obstructing bowel mass diagnosed as colon CA. Pt underwent laparoscopic assisted right colectomy on 1/18. PMH - obesity,  htn, arthritis    PT Comments    Pt admitted with above diagnosis. Pt currently with functional limitations due to balance and endurance deficits. Pt was able to ambulate with RW with good safety overall.  Nausea limiting ability to progress to step training.   Pt will benefit from skilled PT to increase their independence and safety with mobility to allow discharge to the venue listed below.    Follow Up Recommendations  No PT follow up (refuses HHPT )     Equipment Recommendations  Rolling walker with 5" wheels;3in1 (PT)    Recommendations for Other Services       Precautions / Restrictions Precautions Precautions: None Restrictions Weight Bearing Restrictions: No    Mobility  Bed Mobility           Sit to supine: Min guard   General bed mobility comments: Up in chair on arrival. Got in bed after walking as pt fatigued and nauseated.   Transfers Overall transfer level: Needs assistance Equipment used: Rolling walker (2 wheeled) Transfers: Sit to/from Stand Sit to Stand: Min guard         General transfer comment: for safety  Ambulation/Gait Ambulation/Gait assistance: Min guard Ambulation Distance (Feet): 175 Feet Assistive device: Rolling walker (2 wheeled) Gait Pattern/deviations: Step-through pattern;Decreased stride length;Trunk flexed Gait velocity: decr Gait velocity interpretation: Below normal speed for age/gender General Gait Details: Pt with guarded gait due to pain.     Stairs Stairs:  (declined practice today.)          Wheelchair Mobility    Modified Rankin (Stroke Patients Only)       Balance Overall balance assessment: Needs assistance          Standing balance support: Bilateral upper extremity supported;During functional activity Standing balance-Leahy Scale: Poor Standing balance comment: relies on RW for balance.                    Cognition Arousal/Alertness: Awake/alert Behavior During Therapy: WFL for tasks assessed/performed Overall Cognitive Status: Within Functional Limits for tasks assessed                      Exercises      General Comments        Pertinent Vitals/Pain Pain Assessment: 0-10 Pain Score: 4  Pain Location: abdomen Pain Descriptors / Indicators: Squeezing Pain Intervention(s): Limited activity within patient's tolerance;Monitored during session;Premedicated before session;Repositioned  VSS    Home Living                      Prior Function            PT Goals (current goals can now be found in the care plan section) Acute Rehab PT Goals Patient Stated Goal: return home Progress towards PT goals: Progressing toward goals    Frequency    Min 3X/week      PT Plan Current plan remains appropriate    Co-evaluation             End of Session Equipment Utilized During Treatment: Gait belt Activity Tolerance: Patient limited by fatigue (limited by nausea as well.) Patient left: in bed;with call bell/phone within reach  Time: [id number] PT Time Calculation (min) (ACUTE ONLY): 22 min  Charges:  $Gait Training: 8-22 mins                    G Codes:      Becky Brooks 07/11/16, 2:06 PM [employer] Acute Rehabilitation [phone] (pager)

## 2016-07-10 NOTE — Progress Notes (Signed)
7 Days Post-Op  Subjective: Some emesis yesterday, didn't get up, films last night with some dilated loops c/w ileus but today she feels better, she states no more n/v, she passed flatus multiple times and once with a  Small stool  Objective: Vital signs in last 24 hours: Temp:  [98.2 F (36.8 C)] 98.2 F (36.8 C) (01/24 2009) Pulse Rate:  [86-93] 93 (01/24 2009) Resp:  [18-19] 19 (01/24 2009) BP: (137-165)/(60-68) 165/60 (01/24 2009) SpO2:  [97 %] 97 % (01/24 2009) Last BM Date: 07/09/16  Intake/Output from previous day: 01/24 0701 - 01/25 0700 In: 1717.9 [P.O.:240; I.V.:1477.9] Out: 801 [Urine:600; Emesis/NG output:200; Stool:1] Intake/Output this shift: No intake/output data recorded.  General appearance: no distress Resp: clear to auscultation bilaterally Cardio: regular rate and rhythm GI: soft bs present nontender wounds clean  Lab Results:  No results for input(s): WBC, HGB, HCT, PLT in the last 72 hours. BMET No results for input(s): NA, K, CL, CO2, GLUCOSE, BUN, CREATININE, CALCIUM in the last 72 hours. PT/INR No results for input(s): LABPROT, INR in the last 72 hours. ABG No results for input(s): PHART, HCO3 in the last 72 hours.  Invalid input(s): PCO2, PO2  Studies/Results: Dg Abd 1 View  Result Date: 07/09/2016 CLINICAL DATA:  Ileus. Pain left lower quadrant of abdomen. Pt. Had colectomy 6 days ago to remove a colon mass. EXAM: ABDOMEN - 1 VIEW COMPARISON:  None. FINDINGS: Mild persistent gaseous distention of small bowel and colon as can be seen with an ileus. The degree of distention has improved compared with 07/07/2016. There is no evidence of pneumoperitoneum, portal venous gas or pneumatosis. There are no pathologic calcifications along the expected course of the ureters. Left paramedian surgical staples are noted.The osseous structures are unremarkable. IMPRESSION: Mild persistent gaseous distention of small bowel and colon as can be seen with an ileus.  The degree of distention has improved compared with 07/07/2016. Electronically Signed   By: Kathreen Devoid   On: 07/09/2016 18:07    Anti-infectives: Anti-infectives    Start     Dose/Rate Route Frequency Ordered Stop   07/03/16 1152  cefoTEtan in Dextrose 5% (CEFOTAN) 2-2.08 GM-% IVPB    Comments:  Merryl Hacker   : cabinet override      07/03/16 1152 07/03/16 2359   07/03/16 1146  cefoTEtan (CEFOTAN) 2 g in dextrose 5 % 50 mL IVPB     2 g 100 mL/hr over 30 Minutes Intravenous On call to O.R. 07/03/16 1146 07/03/16 1329   07/03/16 1146  neomycin (MYCIFRADIN) tablet 1,000 mg  Status:  Discontinued     1,000 mg Oral 3 times per day 07/03/16 1146 07/03/16 1603   07/03/16 1146  metroNIDAZOLE (FLAGYL) tablet 1,000 mg  Status:  Discontinued     1,000 mg Oral 3 times per day 07/03/16 1146 07/03/16 1603      Assessment/Plan: POD 7lap right colon  1. Continue oral narcotics 2. I think her ileus has resolved, she has not been forthcoming about all symptoms but abdomen is softest it has been, has good bs today, will give clears 3. pulm toilet, oob 4. Lovenox, scds   [facility] 07/10/2016

## 2016-07-10 NOTE — Progress Notes (Signed)
   07/10/16 1000  PT - Assessment/Plan  PT equipment Rolling walker with 5" wheels;3in1 (PT)  Equipment needs as above.  Refuses need for HHPT.  Full treatment note to follow.  Thanks.   [place] [phone] (pager)

## 2016-07-11 MED ORDER — ENSURE ENLIVE PO LIQD: 237.0000 mL | Freq: Two times a day (BID) | ORAL | Status: DC

## 2016-07-11 MED ORDER — PROMETHAZINE HCL 12.5 MG PO TABS: 12.5000 mg | ORAL_TABLET | Freq: Four times a day (QID) | ORAL | 0 refills | Status: DC | PRN

## 2016-07-11 MED ORDER — METHOCARBAMOL 750 MG PO TABS: 750.0000 mg | ORAL_TABLET | Freq: Four times a day (QID) | ORAL | 2 refills | Status: AC | PRN

## 2016-07-11 MED ORDER — OXYCODONE HCL 10 MG PO TABS: 10.0000 mg | ORAL_TABLET | ORAL | 0 refills | Status: DC | PRN

## 2016-07-11 NOTE — Care Management Note (Signed)
Case Management Note  Patient Details  Name: Becky Brooks MRN: [id number] Date of Birth: [birth year]  Subjective/Objective:                    Action/Plan:   Expected Discharge Date:  07/11/16               Expected Discharge Plan:  Home/Self Care  In-House Referral:     Discharge planning Services     Post Acute Care Choice:    Choice offered to:     DME Arranged:  3-N-1Gilford Rile DME Agency:  [place]:  Patient Refused [facility] Agency:     Status of Service:  Completed, signed off  If discussed at [employer] of Stay Meetings, dates discussed:    Additional Comments:  Marilu Favre, RN 07/11/2016, 1:56 PM

## 2016-07-11 NOTE — Progress Notes (Signed)
8 Days Post-Op  Subjective: Continues to improve. Having bowel function, voiding, ambulated, some nausea  Objective: Vital signs in last 24 hours: Temp:  [98.2 F (36.8 C)-99 F (37.2 C)] 98.2 F (36.8 C) (01/26 0455) Pulse Rate:  [72-84] 72 (01/26 0455) Resp:  [18] 18 (01/26 0455) BP: (141-151)/(60-73) 150/60 (01/26 0455) SpO2:  [96 %-98 %] 97 % (01/26 0455) Last BM Date: 07/10/16  Intake/Output from previous day: 01/25 0701 - 01/26 0700 In: 2030 [P.O.:1380; I.V.:650] Out: 401 [Urine:401] Intake/Output this shift: No intake/output data recorded.  Resp: clear to auscultation bilaterally Cardio: regular rate and rhythm GI: soft nt/nd bs present wounds clean  Lab Results:   Recent Labs  07/10/16 0604  WBC 7.7  HGB 7.8*  HCT 26.6*  PLT 346   BMET  Recent Labs  07/10/16 0604  NA 138  K 3.6  CL 105  CO2 24  GLUCOSE 118*  BUN 7  CREATININE 0.53  CALCIUM 9.7   PT/INR No results for input(s): LABPROT, INR in the last 72 hours. ABG No results for input(s): PHART, HCO3 in the last 72 hours.  Invalid input(s): PCO2, PO2  Studies/Results: Dg Abd 1 View  Result Date: 07/09/2016 CLINICAL DATA:  Ileus. Pain left lower quadrant of abdomen. Pt. Had colectomy 6 days ago to remove a colon mass. EXAM: ABDOMEN - 1 VIEW COMPARISON:  None. FINDINGS: Mild persistent gaseous distention of small bowel and colon as can be seen with an ileus. The degree of distention has improved compared with 07/07/2016. There is no evidence of pneumoperitoneum, portal venous gas or pneumatosis. There are no pathologic calcifications along the expected course of the ureters. Left paramedian surgical staples are noted.The osseous structures are unremarkable. IMPRESSION: Mild persistent gaseous distention of small bowel and colon as can be seen with an ileus. The degree of distention has improved compared with 07/07/2016. Electronically Signed   By: Kathreen Devoid   On: 07/09/2016 18:07     Anti-infectives: Anti-infectives    Start     Dose/Rate Route Frequency Ordered Stop   07/03/16 1152  cefoTEtan in Dextrose 5% (CEFOTAN) 2-2.08 GM-% IVPB    Comments:  Merryl Hacker   : cabinet override      07/03/16 1152 07/03/16 2359   07/03/16 1146  cefoTEtan (CEFOTAN) 2 g in dextrose 5 % 50 mL IVPB     2 g 100 mL/hr over 30 Minutes Intravenous On call to O.R. 07/03/16 1146 07/03/16 1329   07/03/16 1146  neomycin (MYCIFRADIN) tablet 1,000 mg  Status:  Discontinued     1,000 mg Oral 3 times per day 07/03/16 1146 07/03/16 1603   07/03/16 1146  metroNIDAZOLE (FLAGYL) tablet 1,000 mg  Status:  Discontinued     1,000 mg Oral 3 times per day 07/03/16 1146 07/03/16 1603      Assessment/Plan: POD 8 lap right colon  1. Continue oral narcotics 2. I think her ileus has resolved, soft diet today 3. pulm toilet, oob 4. Lovenox, scds 5. Will dc later today if does well 6. Will setup oncology as outpt  [facility] 07/11/2016

## 2016-07-11 NOTE — Progress Notes (Signed)
Physical Therapy Treatment Patient Details Name: Becky Brooks MRN: [id number] DOB: [birth year] Today's Date: 07/11/2016    History of Present Illness Pt adm with partially obstructing bowel mass diagnosed as colon CA. Pt underwent laparoscopic assisted right colectomy on 1/18. PMH - obesity,  htn, arthritis    PT Comments    Patient continues to demonstrate decreased activity tolerance however did tolerate stair training this session. Pt will continue to benefit from skilled PT to increased independence and safety with mobility.   Follow Up Recommendations  No PT follow up (refuses HHPT )     Equipment Recommendations  Rolling walker with 5" wheels;3in1 (PT)    Recommendations for Other Services       Precautions / Restrictions Precautions Precautions: None Restrictions Weight Bearing Restrictions: No    Mobility  Bed Mobility Overal bed mobility: Needs Assistance Bed Mobility: Rolling;Sidelying to Sit Rolling: Supervision Sidelying to sit: Supervision       General bed mobility comments: cues for technique; use of rails  Transfers Overall transfer level: Needs assistance Equipment used: Rolling walker (2 wheeled) Transfers: Sit to/from Stand Sit to Stand: Supervision         General transfer comment: supervision for safety; cues for hand placement; X3  Ambulation/Gait Ambulation/Gait assistance: Min guard Ambulation Distance (Feet): 30 Feet Assistive device: Rolling walker (2 wheeled) Gait Pattern/deviations: Step-through pattern;Decreased stride length;Trunk flexed Gait velocity: decr   General Gait Details: pt guarded; cues for posture    Stairs Stairs: Yes   Stair Management: One rail Right;Step to pattern;Sideways Number of Stairs: 10 General stair comments: cues for sequencing and energy conservation technique  Wheelchair Mobility    Modified Rankin (Stroke Patients Only)       Balance Overall balance assessment: Needs assistance    Sitting balance-Leahy Scale: Good     Standing balance support: Bilateral upper extremity supported;During functional activity Standing balance-Leahy Scale: Poor Standing balance comment: relies on RW for balance.                    Cognition Arousal/Alertness: Awake/alert Behavior During Therapy: WFL for tasks assessed/performed Overall Cognitive Status: Within Functional Limits for tasks assessed                      Exercises      General Comments General comments (skin integrity, edema, etc.): family member present throughout session      Pertinent Vitals/Pain Pain Assessment: Faces Faces Pain Scale: Hurts little more Pain Location: abdomen Pain Descriptors / Indicators: Squeezing Pain Intervention(s): Limited activity within patient's tolerance;Monitored during session;Premedicated before session;Repositioned    Home Living                      Prior Function            PT Goals (current goals can now be found in the care plan section) Acute Rehab PT Goals Patient Stated Goal: return home Progress towards PT goals: Progressing toward goals    Frequency    Min 3X/week      PT Plan Current plan remains appropriate    Co-evaluation             End of Session Equipment Utilized During Treatment: Gait belt Activity Tolerance: Patient tolerated treatment well Patient left: with call bell/phone within reach;in chair;with family/visitor present     Time: 1400-1425 PT Time Calculation (min) (ACUTE ONLY): 25 min  Charges:  $Gait Training: 8-22 mins $Therapeutic Activity:  8-22 mins                    G Codes:      [place] April, PTA Pager: [phone]   07/11/2016, 2:36 PM

## 2016-07-11 NOTE — Progress Notes (Signed)
Gave patient the AVS and reviewed it with her. Awaiting her ride.

## 2016-07-11 NOTE — Progress Notes (Signed)
Checked in with patient to see if ready to be d/c, stating she is still feeling nauseous. She asked if she could get another shot of nausea medicine I told her at the moment there wasn't anything I could give her. She said to give her a little bit and she would decide whether she was staying or going.

## 2016-07-11 NOTE — Progress Notes (Signed)
Patient stated she is going to get washed up and try to eat a little dinner then will probably go home.

## 2016-07-15 ENCOUNTER — Telehealth: Payer: Self-pay | Admitting: *Deleted

## 2016-07-15 NOTE — Discharge Summary (Signed)
Physician Discharge Summary  Patient ID: Becky Brooks MRN: [id number] DOB/AGE: [birth year] 60 y.o.  Admit date: 07/03/2016 Discharge date: 07/15/2016  Admission Diagnoses: Obesity Colon mass  Discharge Diagnoses:  Active Problems:   Colon cancer [facility])   Discharged Condition: good  Hospital Course: 61 yof who presented with right colon mass and partial obstruction. She was admitted and underwent lap assisted right colectomy. Postoperatively she had an ileus which resolved. She has been ambulatory. She is tolerating a diet. Her wound has been clean.  She will be discharged home. Colon cancer was noted on pathology with 1/36 nodes positive.  Consults: None  Significant Diagnostic Studies: none  Treatments: surgery: lap assisted right colectomy  Disposition: 01-Home or Self Care   Allergies as of 07/11/2016      Reactions   Aspirin    REACTION: makes stomach hurt      Medication List    TAKE these medications   albuterol 0.63 MG/3ML nebulizer solution Commonly known as:  ACCUNEB USE 1 VIAL IN NEBULIZER 3 TO 4 TIMES DAILY AS NEEDED FOR WHEEZING/SHORTNESS OF BREATH   ALLERGY RELIEF EYE DROPS OP Place 1-2 drops into both eyes 3 (three) times daily as needed (for dry/itchy allergy eyes).   ferrous sulfate 325 (65 FE) MG tablet Take 325 mg by mouth 3 (three) times daily.   FISH OIL + D3 1000-1000 MG-UNIT Caps Take 1 capsule by mouth daily.   methocarbamol 750 MG tablet Commonly known as:  ROBAXIN Take 1 tablet (750 mg total) by mouth 4 (four) times daily as needed (use for muscle cramps/pain).   omeprazole 40 MG capsule Commonly known as:  PRILOSEC Take 1 capsule (40 mg total) by mouth daily.   Oxycodone HCl 10 MG Tabs Take 1 tablet (10 mg total) by mouth every 4 (four) hours as needed for moderate pain.   promethazine 12.5 MG tablet Commonly known as:  PHENERGAN Take 1 tablet (12.5 mg total) by mouth every 6 (six) hours as needed for nausea or vomiting.       Follow-up Information    Jerusalem Wert, MD Follow up in 1 week(s).   Specialty:  General Surgery Contact information: [address] [place] [address] [phone]           Signed: Rolm Bookbinder 07/15/2016, 12:46 PM

## 2016-07-15 NOTE — Telephone Encounter (Signed)
Spoke with patient and provided new patient appointment for 07/24/16 at 1:45/2:00 with Dr. Benay Spice. Informed of location of [place], valet service, and registration process. Reminded to bring photo ID,  insurance cards and a current medication list, including supplements. Patient verbalizes understanding. She was going to cancel appointment due to not having her copay right now. Informed her that she will be billed for the copay, just tell them at registration that she does not have it. Informed her this happens a lot here and is nothing to worry about.

## 2016-07-24 ENCOUNTER — Ambulatory Visit (HOSPITAL_BASED_OUTPATIENT_CLINIC_OR_DEPARTMENT_OTHER): Payer: BC Managed Care – PPO | Admitting: Oncology

## 2016-07-24 ENCOUNTER — Telehealth: Payer: Self-pay | Admitting: *Deleted

## 2016-07-24 ENCOUNTER — Telehealth: Payer: Self-pay | Admitting: Oncology

## 2016-07-24 ENCOUNTER — Other Ambulatory Visit: Payer: Self-pay | Admitting: General Surgery

## 2016-07-24 ENCOUNTER — Telehealth: Payer: Self-pay

## 2016-07-24 VITALS — BP 153/79 | HR 88 | Temp 98.4°F | Resp 18 | Ht 64.0 in | Wt 188.1 lb

## 2016-07-24 DIAGNOSIS — C182 Malignant neoplasm of ascending colon: Secondary | ICD-10-CM

## 2016-07-24 NOTE — Telephone Encounter (Signed)
Appointments scheduled per 2/8 LOS. Patient given AVS report and calendars with future scheduled appointments.  °

## 2016-07-24 NOTE — Progress Notes (Signed)
Called Dr. Cristal Generous office to set up port placement before chemo on 2/22. Spoke with Colletta [place] who will get pt scheduled and call her.

## 2016-07-24 NOTE — Progress Notes (Signed)
[facility] New Patient Consult   Referring MD: Shirell Struthers 61 y.o.  13-May-1956    Reason for Referral: Colon cancer   HPI: Ms. Lemere reports malaise for the past year. She reports having a presyncope event and saw her primary physician. A CBC 05/20/2016 found the hemoglobin at 6.8 with an MCV of 68. She was referred to Dr. Havery Moros and was taken to a colonoscopy 06/05/2016. A partially obstructing mass was found in the proximal ascending colon. The mass was biopsied and tattooed. No other polyps. The pathology revealed fragments of high-grade adenomatous dysplasia. In one fragment there was a small focus suspicious for adenocarcinoma.  CTs of the pelvis on 06/03/2016 revealed clear lung bases. The liver appeared normal. An apple core lesion was noted at the cecum concerning for a colon cancer. No evidence of obstruction. A 13 mm ileocolonic node and smaller adjacent nodes were noted. No enlarged with peritoneal lymph nodes. No ascites or peritoneal nodularity. A CT of the chest on 06/11/2016 revealed no evidence of metastatic disease.  She was referred to Dr. Donne Hazel and taken the operating room on 07/03/2016 for a laparoscopic assisted right colectomy. The tumor was identified at the cecum with a desmoplastic reaction adherent to the anterior bowel wall. No direct invasion. There were enlarged lymph nodes.  The pathology ([id number]) revealed a moderately differentiated invasive adenocarcinoma. Adenocarcinoma extended into pericolonic soft tissue. Lymphovascular invasion was identified. No perineural invasion. One of 36 lymph nodes was positive for metastatic carcinoma. The margins are negative. No macroscopic tumor perforation. No loss of mismatch repair protein expression, MSI-low.  She continues to have malaise following surgery. She reports loose bowel movements. She is referred for oncology evaluation.  Past Medical History:  Diagnosis Date  .  Anemia-Microcytic  December 2017   . Anxiety   . Arthritis   . Asthma   . Essential hypertension    no meds  . G4 P4    . GERD (gastroesophageal reflux disease)   . History of blood transfusion 05/2016  . Vitamin D deficiency     Past Surgical History:  Procedure Laterality Date  . CARPAL TUNNEL RELEASE    . CHOLECYSTECTOMY    . LAPAROSCOPIC RIGHT COLECTOMY  07/03/2016  . LAPAROSCOPIC RIGHT COLECTOMY Right 07/03/2016   Procedure: LAPAROSCOPIC ASSISTED RIGHT COLECTOMY;  Surgeon: Rolm Bookbinder, MD;  Location: [facility];  Service: General;  Laterality: Right;  . REDUCTION MAMMAPLASTY    . ROTATOR CUFF REPAIR      Medications: Reviewed  Allergies:  Allergies  Allergen Reactions  . Aspirin     REACTION: makes stomach hurt    Family history: She has one sister. No family history of cancer.  Social History:   She lives with her daughter in [place]. 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 She does not use cigarettes. Rare alcohol use. No risk factor for HIV or hepatitis. No Transfusion prior to December 2017    ROS:   Positives include: Malaise, dyspnea, and presyncope prior to surgery, anorexia and 25 pound weight loss prior to surgery, dark stool and constipation/diarrhea prior to surgery, nausea, chronic headaches, decreased visual acuity and a "floaters" in the right eye "Thomas pain at the right upper anterior chest near the shoulder  A complete ROS was otherwise negative.  Physical Exam:  Blood pressure (!) 153/79, pulse 88, temperature 98.4 F (36.9 C), temperature source  Oral, resp. rate 18, height '5\' 4"'  (1.626 m), weight 188 lb 1.6 oz (85.3 kg), SpO2 100 %.  HEENT: Oropharynx without visible mass, neck without mass Lungs: Clear bilaterally Cardiac: Regular rate and rhythm Abdomen: No hepatosplenomegaly, no mass, tender in the right mid abdomen, surgical wounds have healed, remaining stable  at 1 trocar site-removed  Vascular: No leg edema Lymph nodes: No cervical, supraclavicular, axillary, or inguinal nodes Neurologic: Alert and oriented, the motor exam appears intact in the upper and lower extremities Skin: No rash Musculoskeletal: No spine tenderness   LAB:  CBC  Lab Results  Component Value Date   WBC 7.7 07/10/2016   HGB 7.8 (L) 07/10/2016   HCT 26.6 (L) 07/10/2016   MCV 71.9 (L) 07/10/2016   PLT 346 07/10/2016   NEUTROABS 3.3 06/30/2016     CMP      Component Value Date/Time   NA 138 07/10/2016 0604   K 3.6 07/10/2016 0604   CL 105 07/10/2016 0604   CO2 24 07/10/2016 0604   GLUCOSE 118 (H) 07/10/2016 0604   BUN 7 07/10/2016 0604   CREATININE 0.53 07/10/2016 0604   CALCIUM 9.7 07/10/2016 0604   PROT 7.5 06/30/2016 1432   ALBUMIN 3.4 (L) 06/30/2016 1432   AST 19 06/30/2016 1432   ALT 11 (L) 06/30/2016 1432   ALKPHOS 95 06/30/2016 1432   BILITOT 0.5 06/30/2016 1432   GFRNONAA >60 07/10/2016 0604   GFRAA >60 07/10/2016 0604  CEA on 06/11/2016-1.7  Imaging: As per history of present illness, CT abdomen/pelvis 06/03/2016-images reviewed    Assessment/Plan:   1. Adenocarcinoma the ascending colon, stage IIIB (T3, N1a), status post a right colectomy 07/03/2016  MSI-low, no loss of mismatch repair protein expression  1/36 lymph nodes positive, lymphovascular invasion identified  Normal preoperative CEA  2. Iron deficiency anemia secondary to #1  3.   History of hypertension   Disposition:   Ms. Somma has been diagnosed with stage III colon cancer. I discussed the prognosis and reviewed the details of the surgical pathology report with her. She has a significant chance of developing recurrent colon cancer over the next several years, though her prognosis is better than the average stage III patient. The significance of the MSI-low phenotype is unclear. Mismatch repair protein expression is intact and she does not have a personal or family  history to suggest [place]. We will check for a BRAF mutation.  I recommend adjuvant 5-fluorouracil and oxaliplatin chemotherapy. We scheduled the expected decrease in the relapse rate associated with 5-fluorouracil and the addition of oxaliplatin. I reviewed recent data supporting 3 months versus 6 months of adjuvant therapy in patients with early stage III disease. I recommend 3 months of CAPOX.  We reviewed the potential toxicities associated with this regimen including the chance for nausea/vomiting, mucositis, diarrhea, alopecia, and hematologic toxicity. We discussed the sun sensitivity, rash, hyperpigmentation, and hand/foot syndrome associated with capecitabine. We discussed the allergic reaction and various types of neuropathy seen with oxaliplatin. She will attend a chemotherapy teaching class. She agrees to proceed.  Ms. Massingale will be referred to Dr. Donne Hazel for placement of a Port-A-Cath with the plan to begin a first cycle of chemotherapy on 08/07/2016. She will be seen for an office visit that day. We will check a CBC when she returns for the chemotherapy teaching class to follow-up on the iron deficiency anemia.   60 minutes were spent with the patient today. The majority of the time was used for counseling and  coordination of care.   [name], MD  07/24/2016, 4:29 PM

## 2016-07-24 NOTE — Telephone Encounter (Signed)
Per 2/8 LOS and staff message I have schedueld appts an  notified the scheduler

## 2016-07-24 NOTE — Telephone Encounter (Signed)
Called [facility], pt still has one staple left from previous surgery all other staples were removed, Dr.Sherrill requesting to remove. Per Dr. Donne Hazel okay to remove.

## 2016-07-24 NOTE — Progress Notes (Signed)
START ON PATHWAY REGIMEN - Colorectal  COS82: CapeOx q21 Days x 4 Cycles   A cycle is every 21 days:     Capecitabine (Xeloda(R)) 850 mg/m2 orally twice a day days 1 through 14 followed by 7 days off Dose Mod: None     Oxaliplatin (Eloxatin(R)) 130 mg/m2 in 250 mL D5W IV over 2 hours day 1 every 21 days Dose Mod: None  **Always confirm dose/schedule in your pharmacy ordering system**    Patient Characteristics: Colon Adjuvant, Stage III, Low Risk (T1-3, N1) Current evidence of distant metastases? No AJCC T Category: TX AJCC N Category: NX AJCC M Category: Staged < 8th Ed. AJCC 8 Stage Grouping: IIIB  Intent of Therapy: Curative Intent, Discussed with Patient

## 2016-07-25 ENCOUNTER — Telehealth: Payer: Self-pay | Admitting: Pharmacist

## 2016-07-25 ENCOUNTER — Other Ambulatory Visit: Payer: Self-pay | Admitting: *Deleted

## 2016-07-25 ENCOUNTER — Encounter: Payer: Self-pay | Admitting: *Deleted

## 2016-07-25 MED ORDER — CAPECITABINE 500 MG PO TABS: 1500.0000 mg | ORAL_TABLET | Freq: Two times a day (BID) | ORAL | 0 refills | Status: DC

## 2016-07-25 NOTE — Telephone Encounter (Signed)
Oral Chemotherapy Pharmacist Encounter  Received new prescription for Xeloda for patient to be used with oxaliplatin for adjuvant treatment of colon cancer  CBC and BMET from 07/10/16 reviewed, ok for treatment CMET from 06/30/16 reviewed, no evidence of liver dysfunction  Current medication list in Epic assessed, Category C interaction with Prilosec identified due to limited information showing decreased efficacy of Xeloda when used with acid suppression therapy. Data in this area is conflicting and no prospective data for this interaction is available. Per medication reconciliation performed 07/24/16, patient is not taking the Prilosec anyways.  No change to therapy is indicated at this time.  Noted patient with [facility] insurance managed through the [facility], therefore prescription medications are handled through [facility]. Xeloda prescription and clinical information will be faxed to [facility] ([facility]) in [place], [place] at [phone] for benefits analysis.  Oral Oncology Clinic will continue to follow.  Johny Drilling, PharmD, BCPS, BCOP 07/25/2016  12:09 PM Oral Oncology Clinic [phone]

## 2016-07-25 NOTE — Progress Notes (Signed)
Tammy at [facility] pathology notified per order of Dr. Benay Spice to add BRAF to pathology from 07/03/16.

## 2016-07-28 ENCOUNTER — Encounter (HOSPITAL_BASED_OUTPATIENT_CLINIC_OR_DEPARTMENT_OTHER): Payer: Self-pay | Admitting: *Deleted

## 2016-07-29 ENCOUNTER — Encounter (HOSPITAL_BASED_OUTPATIENT_CLINIC_OR_DEPARTMENT_OTHER): Admission: RE | Payer: Self-pay | Source: Ambulatory Visit

## 2016-07-29 ENCOUNTER — Telehealth: Payer: Self-pay | Admitting: *Deleted

## 2016-07-29 ENCOUNTER — Ambulatory Visit (HOSPITAL_BASED_OUTPATIENT_CLINIC_OR_DEPARTMENT_OTHER): Admission: RE | Admit: 2016-07-29 | Payer: BC Managed Care – PPO | Source: Ambulatory Visit | Admitting: General Surgery

## 2016-07-29 SURGERY — INSERTION, TUNNELED CENTRAL VENOUS DEVICE, WITH PORT
Anesthesia: General | Site: Breast

## 2016-07-29 NOTE — Telephone Encounter (Signed)
Oral Chemotherapy Pharmacist Encounter  I called [facility] ([facility]) in [place], [place] at [phone] for status update of patient's Xeloda. Prescription was sent 07/25/16. Prior authorization has been initiated, is in process. They do not need anything from the office at this time. Technician will call me to update on status later today.  Oral Oncology Clinic will continue to follow.  Johny Drilling, PharmD, BCPS, BCOP 07/29/2016  9:53 AM Oral Oncology Clinic [phone]

## 2016-07-29 NOTE — Telephone Encounter (Signed)
Oral Chemotherapy Pharmacist Encounter  Received notification from [facility] that prior authorization for patient's Xeloda has been approved Copay $100, they will reach out to the patient today to schedule delivery.  Oral Oncology Clinic will continue to follow for initial counseling, start date, toxicity and adherence management.  Johny Drilling, PharmD, BCPS, BCOP 07/29/2016  12:34 PM Oral Oncology Clinic [phone]

## 2016-07-30 ENCOUNTER — Telehealth: Payer: Self-pay

## 2016-07-30 ENCOUNTER — Other Ambulatory Visit: Payer: Self-pay | Admitting: *Deleted

## 2016-07-30 ENCOUNTER — Encounter: Payer: Self-pay | Admitting: *Deleted

## 2016-07-30 ENCOUNTER — Other Ambulatory Visit (HOSPITAL_BASED_OUTPATIENT_CLINIC_OR_DEPARTMENT_OTHER): Payer: BC Managed Care – PPO

## 2016-07-30 ENCOUNTER — Other Ambulatory Visit: Payer: BC Managed Care – PPO

## 2016-07-30 DIAGNOSIS — C182 Malignant neoplasm of ascending colon: Secondary | ICD-10-CM

## 2016-07-30 LAB — CBC WITH DIFFERENTIAL/PLATELET
BASO%: 0.9 % (ref 0.0–2.0)
Basophils Absolute: 0 10*3/uL (ref 0.0–0.1)
EOS ABS: 0.3 10*3/uL (ref 0.0–0.5)
EOS%: 7.6 % — ABNORMAL HIGH (ref 0.0–7.0)
HCT: 31.5 % — ABNORMAL LOW (ref 34.8–46.6)
HEMOGLOBIN: 9.1 g/dL — AB (ref 11.6–15.9)
LYMPH%: 35.1 % (ref 14.0–49.7)
MCH: 20 pg — ABNORMAL LOW (ref 25.1–34.0)
MCHC: 28.9 g/dL — ABNORMAL LOW (ref 31.5–36.0)
MCV: 69.2 fL — ABNORMAL LOW (ref 79.5–101.0)
MONO#: 0.4 10*3/uL (ref 0.1–0.9)
MONO%: 10.2 % (ref 0.0–14.0)
NEUT%: 46.2 % (ref 38.4–76.8)
NEUTROS ABS: 2 10*3/uL (ref 1.5–6.5)
PLATELETS: 382 10*3/uL (ref 145–400)
RBC: 4.55 10*6/uL (ref 3.70–5.45)
RDW: 18.1 % — AB (ref 11.2–14.5)
WBC: 4.3 10*3/uL (ref 3.9–10.3)
lymph#: 1.5 10*3/uL (ref 0.9–3.3)

## 2016-07-30 LAB — COMPREHENSIVE METABOLIC PANEL
ALBUMIN: 4.1 g/dL (ref 3.5–5.0)
ALK PHOS: 133 U/L (ref 40–150)
ALT: 12 U/L (ref 0–55)
AST: 14 U/L (ref 5–34)
Anion Gap: 10 mEq/L (ref 3–11)
BILIRUBIN TOTAL: 0.38 mg/dL (ref 0.20–1.20)
BUN: 8 mg/dL (ref 7.0–26.0)
CO2: 24 meq/L (ref 22–29)
CREATININE: 0.7 mg/dL (ref 0.6–1.1)
Calcium: 10.9 mg/dL — ABNORMAL HIGH (ref 8.4–10.4)
Chloride: 106 mEq/L (ref 98–109)
EGFR: >90 mL/min/{1.73_m2} (ref >90)
GLUCOSE: 98 mg/dL (ref 70–140)
Potassium: 4 mEq/L (ref 3.5–5.1)
SODIUM: 139 meq/L (ref 136–145)
TOTAL PROTEIN: 8.9 g/dL — AB (ref 6.4–8.3)

## 2016-07-30 NOTE — Telephone Encounter (Signed)
..  Oral Chemotherapy Pharmacist Encounter   I received notification from [facility] RN that patient informed her during chemotherapy education class that she could not afford the $100 copay for the Xeloda.  [facility] was going to refer Ms Schwebke to [address] to determine if any [facility] assistance was available since the colon copay foundation is closed.  Will follow up with [facility].   Thank you,  Henreitta Leber, PharmD Oral Chemotherapy Clinic

## 2016-07-31 ENCOUNTER — Telehealth: Payer: Self-pay

## 2016-07-31 ENCOUNTER — Encounter: Payer: Self-pay | Admitting: Oncology

## 2016-07-31 NOTE — Telephone Encounter (Signed)
..  Oral Chemotherapy Pharmacist Encounter   I spoke with patient about copay $100, [facility] has been trying to contact her for delivery.  She has spoken with [facility] about assistance from [facility].  The pharmacist at [facility] has as well looked into assistance but with no luck.  Patient also commented she is in the process of filling out paperwork for the hospital assistance but is waiting on a form from the IRS to complete the application.  We will wait to hear the outcome.   Thank you,  Henreitta Leber, PharmD Oral Chemotherapy Clinic

## 2016-07-31 NOTE — Progress Notes (Signed)
Spoke w/ pt and introduced myself as her FA and discussed financial assistance.  Pt needs assistance paying for Xeloda but unfortunately there aren't any foundations offering copay assistance for that drug or for her Dx so I offered the [place].  She will bring her proof of income to see if she qualifies.

## 2016-08-01 ENCOUNTER — Encounter: Payer: Self-pay | Admitting: Oncology

## 2016-08-01 ENCOUNTER — Telehealth: Payer: Self-pay | Admitting: Oncology

## 2016-08-01 NOTE — Progress Notes (Signed)
Pt is approved for the $400 [facility] grant.  °

## 2016-08-01 NOTE — Telephone Encounter (Signed)
lvm to pt of r/s appts to 3/1 at 915 am per LOS

## 2016-08-06 ENCOUNTER — Telehealth: Payer: Self-pay | Admitting: Nurse Practitioner

## 2016-08-06 NOTE — Telephone Encounter (Signed)
Patient called to have her appointments canceled. She does not wish to reschedule at this time.

## 2016-08-07 ENCOUNTER — Telehealth: Payer: Self-pay | Admitting: Oncology

## 2016-08-07 ENCOUNTER — Telehealth: Payer: Self-pay | Admitting: *Deleted

## 2016-08-07 ENCOUNTER — Ambulatory Visit: Payer: Self-pay

## 2016-08-07 ENCOUNTER — Ambulatory Visit: Payer: Self-pay | Admitting: Nurse Practitioner

## 2016-08-07 NOTE — Telephone Encounter (Signed)
Called pt, she reports she has decided against chemo. Pt reports she is beginning to feel better, still has pain. Recommended she come in to see Dr. Benay Spice  for a discussion and new plan. She agrees to office visit. Message to schedulers.

## 2016-08-07 NOTE — Telephone Encounter (Signed)
Oral Chemotherapy Pharmacist Encounter  I called [facility] at [phone] to follow-up on status of patient's Xeloda prescription. They have attempted to reach the patient unsuccessfully on multiple occasions.  Noted that patient called today (08/07/16) to state she has decided against chemotherapy. Patient added to MD schedule tomorrow (2/23) am. I will alert the pharmacy with tthe patient's decision once she sees MD in the morning.  Oral Oncology Clinic will continue to follow.  Becky Brooks, PharmD, BCPS, BCOP 08/07/2016  12:30 PM Oral Oncology Clinic [phone]

## 2016-08-07 NOTE — Telephone Encounter (Signed)
lvm to inform pt of 2/23 appt at 0830 per LOS

## 2016-08-08 ENCOUNTER — Ambulatory Visit (HOSPITAL_BASED_OUTPATIENT_CLINIC_OR_DEPARTMENT_OTHER): Payer: BC Managed Care – PPO | Admitting: Oncology

## 2016-08-08 VITALS — BP 159/80 | HR 87 | Temp 98.4°F | Resp 18 | Ht 64.0 in | Wt 190.6 lb

## 2016-08-08 DIAGNOSIS — D509 Iron deficiency anemia, unspecified: Secondary | ICD-10-CM | POA: Diagnosis not present

## 2016-08-08 DIAGNOSIS — C182 Malignant neoplasm of ascending colon: Secondary | ICD-10-CM

## 2016-08-08 NOTE — Progress Notes (Signed)
  Becky Brooks OFFICE PROGRESS NOTE   Diagnosis: Colon cancer  INTERVAL HISTORY:   Becky Brooks canceled the Port-A-Cath placement. She has decided against chemotherapy. She attended the chemotherapy teaching class. She continues to have intermittent abdominal pain. She reports a "pulling" type sensation in the abdomen. She also has pain at the right groin. She is not taking iron.  Objective:  Vital signs in last 24 hours:  Blood pressure (!) 159/80, pulse 87, temperature 98.4 F (36.9 C), temperature source Oral, resp. rate 18, height '5\' 4"'$  (1.626 m), weight 190 lb 9.6 oz (86.5 kg), SpO2 100 %.   Resp: Lungs clear bilaterally Cardio: Regular rate and rhythm GI: Healed surgical incisions, soft, nontender, no mass, examination of the right groin and upper thigh is unremarkable Vascular: No leg edema   Lab Results:  Lab Results  Component Value Date   WBC 4.3 07/30/2016   HGB 9.1 (L) 07/30/2016   HCT 31.5 (L) 07/30/2016   MCV 69.2 (L) 07/30/2016   PLT 382 07/30/2016   NEUTROABS 2.0 07/30/2016     Medications: I have reviewed the patient's current medications.  Assessment/Plan: 1. Adenocarcinoma the ascending colon, stage IIIB (T3, N1a), status post a right colectomy 07/03/2016 ? MSI-low, no loss of mismatch repair protein expression ? 1/36 lymph nodes positive, lymphovascular invasion identified ? Normal preoperative CEA  2. Iron deficiency anemia secondary to #1  3.   History of hypertension     Disposition:  Becky Brooks has been diagnosed with stage III colon cancer. I reviewed the prognosis and adjuvant treatment options. I recommend adjuvant chemotherapy. I explained the benefit associated with adjuvant chemotherapy in this setting. We discussed single agent Xeloda and CAPOX. She declines all chemotherapy.  She declined a follow-up CBC today. The anemia should correct over the next several months.  She agrees to a follow-up office visit and CEA  in 5 months. She understands that she should have a surveillance colonoscopy in one year.  She will contact us if she changes her mind regarding adjuvant therapy.  [name], MD  08/08/2016  9:21 AM

## 2016-08-12 NOTE — Telephone Encounter (Signed)
Oral Chemotherapy Pharmacist Encounter  Noted patient has declined chemotherapy. Her prescription has been cancelled at her insurance.  No further needs from [facility] identified at this time. Oral Oncology Clinic will sign off. Please let us know if we can be of assistance in the future.  Johny Drilling, PharmD, BCPS, BCOP 08/12/2016  12:17 PM Oral Oncology Clinic [phone]

## 2016-08-14 ENCOUNTER — Ambulatory Visit: Payer: Self-pay

## 2016-08-14 ENCOUNTER — Other Ambulatory Visit: Payer: Self-pay

## 2016-08-14 ENCOUNTER — Ambulatory Visit: Payer: Self-pay | Admitting: Nurse Practitioner

## 2016-08-19 ENCOUNTER — Telehealth: Payer: Self-pay | Admitting: Oncology

## 2016-08-19 NOTE — Telephone Encounter (Signed)
Left message on VM about appointments and a call back number for questions

## 2016-08-28 ENCOUNTER — Telehealth: Payer: Self-pay | Admitting: *Deleted

## 2016-08-28 NOTE — Telephone Encounter (Signed)
"  I still have pain in my back.  I am real tired. I restarted the iron twice a day after my last visit.  I also forgot to ask for a doctor's note. Return number [phone]"  Further assessment reveals: 1. Pain in my lower back goes down the right leg, groin, mid thigh to knee.  Pain started after surgery in January.  I was told it would go away after I got home and moving around more but it hasn't.  I take one oxycodone 10 mg before go to bed the pain affects my sleep. 2. I'm almost out of oxycodone 10 mg.  Didn't take much after surgery because it makes me feel bad, nervousness and makes me mean.  I can't describe it.  I do not have anymore Robaxin. 3. I'm very tired.  Have remained out of work since surgery.  I can't work.  I do not know when I'll recover and get my energy level back up. I need to get back to where I can walk.   I get some sleep.  I eat and drink.  Actually chew on ice more than I drink water.  When I'm walking I have to sit every fifteen minutes or so.  4. I have to give a letter to my lawyer monthly and she has asked for a letter.  Marcene Brawn McIvor (Ph: [phone], FAX: [phone]) is the lawyer.  I do not work and she sends the letter to [employer].  Letter "To whom it may concern" should read I'm unable to work and remain out of work is required monthly.  Fax a copy to lawyer and I can pick up a copy for my records.  5. Dr. Donne Hazel has turned me over to Dr. Benay Spice and told me I would receive future pain medications and these letters from Dr. Benay Spice.

## 2016-08-28 NOTE — Telephone Encounter (Signed)
Reviewed pt's call with Dr. Benay Spice: We can check HGB to see if this is contributing to the fatigue. Follow up with Dr. Donne Hazel or primary care MD for pain. Letter will need to come from PCP.  Left message for pt to call back.

## 2016-09-01 ENCOUNTER — Other Ambulatory Visit: Payer: Self-pay | Admitting: *Deleted

## 2016-09-01 ENCOUNTER — Telehealth: Payer: Self-pay | Admitting: *Deleted

## 2016-09-01 ENCOUNTER — Ambulatory Visit (HOSPITAL_BASED_OUTPATIENT_CLINIC_OR_DEPARTMENT_OTHER): Payer: BC Managed Care – PPO

## 2016-09-01 DIAGNOSIS — C182 Malignant neoplasm of ascending colon: Secondary | ICD-10-CM

## 2016-09-01 LAB — CBC WITH DIFFERENTIAL/PLATELET
BASO%: 0.4 % (ref 0.0–2.0)
BASOS ABS: 0 10*3/uL (ref 0.0–0.1)
EOS%: 5.8 % (ref 0.0–7.0)
Eosinophils Absolute: 0.4 10*3/uL (ref 0.0–0.5)
HCT: 32.2 % — ABNORMAL LOW (ref 34.8–46.6)
HGB: 9.3 g/dL — ABNORMAL LOW (ref 11.6–15.9)
LYMPH%: 33.6 % (ref 14.0–49.7)
MCH: 19.6 pg — AB (ref 25.1–34.0)
MCHC: 28.9 g/dL — ABNORMAL LOW (ref 31.5–36.0)
MCV: 67.9 fL — AB (ref 79.5–101.0)
MONO#: 0.6 10*3/uL (ref 0.1–0.9)
MONO%: 8 % (ref 0.0–14.0)
NEUT#: 3.7 10*3/uL (ref 1.5–6.5)
NEUT%: 52.2 % (ref 38.4–76.8)
Platelets: 369 10*3/uL (ref 145–400)
RBC: 4.74 10*6/uL (ref 3.70–5.45)
RDW: 18.3 % — ABNORMAL HIGH (ref 11.2–14.5)
WBC: 7 10*3/uL (ref 3.9–10.3)
lymph#: 2.4 10*3/uL (ref 0.9–3.3)
nRBC: 0 % (ref 0–0)

## 2016-09-01 NOTE — Telephone Encounter (Signed)
Repeat call placed to patient to see if she is available to come in to [facility] for a HGB check per order of Dr. Benay Spice.  Patient states that she can come in today for lab appt.  Patient also informed per order of Dr. Benay Spice to f/u with Dr. Donne Hazel or PCP for pain and that letter she requested will need to come from her PCP.  Patient appreciative of return call and has no further questions at this time.

## 2016-09-01 NOTE — Telephone Encounter (Signed)
Spoke with patient's daughter, Angus Palms and informed her per order of Dr. Benay Spice that patient's HGB is better and to continue iron.  Instructed Janelle to have her mother call [facility] tomorrow with any questions or concerns.

## 2016-12-23 ENCOUNTER — Other Ambulatory Visit: Payer: Self-pay

## 2016-12-23 ENCOUNTER — Ambulatory Visit: Payer: Self-pay | Admitting: Nurse Practitioner

## 2017-01-14 ENCOUNTER — Telehealth: Payer: Self-pay | Admitting: Oncology

## 2017-01-14 NOTE — Telephone Encounter (Signed)
lvm to inform pt of 8/15 appt at 1130 per sch msg

## 2017-01-28 ENCOUNTER — Other Ambulatory Visit (HOSPITAL_BASED_OUTPATIENT_CLINIC_OR_DEPARTMENT_OTHER): Payer: BC Managed Care – PPO

## 2017-01-28 ENCOUNTER — Ambulatory Visit (HOSPITAL_BASED_OUTPATIENT_CLINIC_OR_DEPARTMENT_OTHER): Payer: BC Managed Care – PPO | Admitting: Nurse Practitioner

## 2017-01-28 VITALS — BP 175/77 | HR 77 | Temp 98.6°F | Resp 20 | Ht 64.0 in | Wt 212.7 lb

## 2017-01-28 DIAGNOSIS — C182 Malignant neoplasm of ascending colon: Secondary | ICD-10-CM

## 2017-01-28 DIAGNOSIS — D509 Iron deficiency anemia, unspecified: Secondary | ICD-10-CM

## 2017-01-28 DIAGNOSIS — C189 Malignant neoplasm of colon, unspecified: Secondary | ICD-10-CM

## 2017-01-28 LAB — COMPREHENSIVE METABOLIC PANEL
ALBUMIN: 3.8 g/dL (ref 3.5–5.0)
ALK PHOS: 129 U/L (ref 40–150)
ALT: 18 U/L (ref 0–55)
ANION GAP: 10 meq/L (ref 3–11)
AST: 19 U/L (ref 5–34)
BUN: 11 mg/dL (ref 7.0–26.0)
CALCIUM: 10.5 mg/dL — AB (ref 8.4–10.4)
CO2: 24 mEq/L (ref 22–29)
Chloride: 105 mEq/L (ref 98–109)
Creatinine: 0.7 mg/dL (ref 0.6–1.1)
Glucose: 141 mg/dl — ABNORMAL HIGH (ref 70–140)
POTASSIUM: 3.8 meq/L (ref 3.5–5.1)
Sodium: 139 mEq/L (ref 136–145)
Total Bilirubin: 0.54 mg/dL (ref 0.20–1.20)
Total Protein: 8.3 g/dL (ref 6.4–8.3)

## 2017-01-28 LAB — CEA (IN HOUSE-CHCC): CEA (CHCC-IN HOUSE): 1.56 ng/mL (ref 0.00–5.00)

## 2017-01-28 NOTE — Progress Notes (Signed)
  [facility] OFFICE PROGRESS NOTE   Diagnosis:  Colon cancer  INTERVAL HISTORY:   Becky Brooks returns as scheduled. She continues to feel tired. Bowels intermittently erratic since surgery. No bloody or black stools. She has occasional upper abdominal pain.  Objective:  Vital signs in last 24 hours:  Blood pressure (!) 175/77, pulse 77, temperature 98.6 F (37 C), temperature source Oral, resp. rate 20, height '5\' 4"'$  (1.626 m), weight 212 lb 11.2 oz (96.5 kg), SpO2 99 %.    HEENT: Neck without mass. Lymphatics: No palpable cervical, supraclavicular, axillary or inguinal lymph nodes. Resp: Lungs clear bilaterally. Cardio: Regular rate and rhythm. GI: Abdomen soft and nontender. No hepatosplenomegaly. Vascular: No leg edema.   Lab Results:  Lab Results  Component Value Date   WBC 7.0 09/01/2016   HGB 9.3 (L) 09/01/2016   HCT 32.2 (L) 09/01/2016   MCV 67.9 (L) 09/01/2016   PLT 369 09/01/2016   NEUTROABS 3.7 09/01/2016    Imaging:  No results found.  Medications: I have reviewed the patient's current medications.  Assessment/Plan: 1. Adenocarcinoma the ascending colon, stage IIIB (T3, N1a), status post a right colectomy 07/03/2016 ? MSI-low, no loss of mismatch repair protein expression ? 1/36 lymph nodes positive, lymphovascular invasion identified ? Normal preoperative CEA ? She declined adjuvant chemotherapy.  2. Iron deficiency anemia secondary to #1. She continues oral iron.  3. History of hypertension  4.   Mildly elevated calcium 07/30/2016, 01/28/2017. Defer to PCP.    Disposition: Becky Brooks remains in clinical remission from colon cancer. We will follow-up on the CEA from today. We referred her for surveillance CT scans in January 2019. We also referred her for a surveillance colonoscopy January 2019. She will return for a follow-up visit a few days after the CT scans to review the results. She will contact the office in the interim  with any problems.  The calcium level was mildly elevated 07/30/2016 and is mildly elevated today. We will forward a copy of these labs to Dr. Ernie Hew.  Plan reviewed with Dr. Benay Spice.   Ned Card ANP/GNP-BC   01/28/2017  11:59 AM

## 2017-01-30 ENCOUNTER — Telehealth: Payer: Self-pay | Admitting: Oncology

## 2017-01-30 NOTE — Telephone Encounter (Signed)
Not able to reach patient by phone. January schedule mailed. [facility] will contact patient regarding CT. [facility] will contact patient regarding colonoscopy.

## 2017-04-26 IMAGING — CR DG ABDOMEN 1V
2 series · 2 of 2 positions shown · non-contrast
Comparison: CT abdomen and pelvis June 03, 2016

CLINICAL DATA: Abdominal pain.  Recent partial colectomy

EXAM:
ABDOMEN - 1 VIEW

[abdomen kub (1 of 2)]
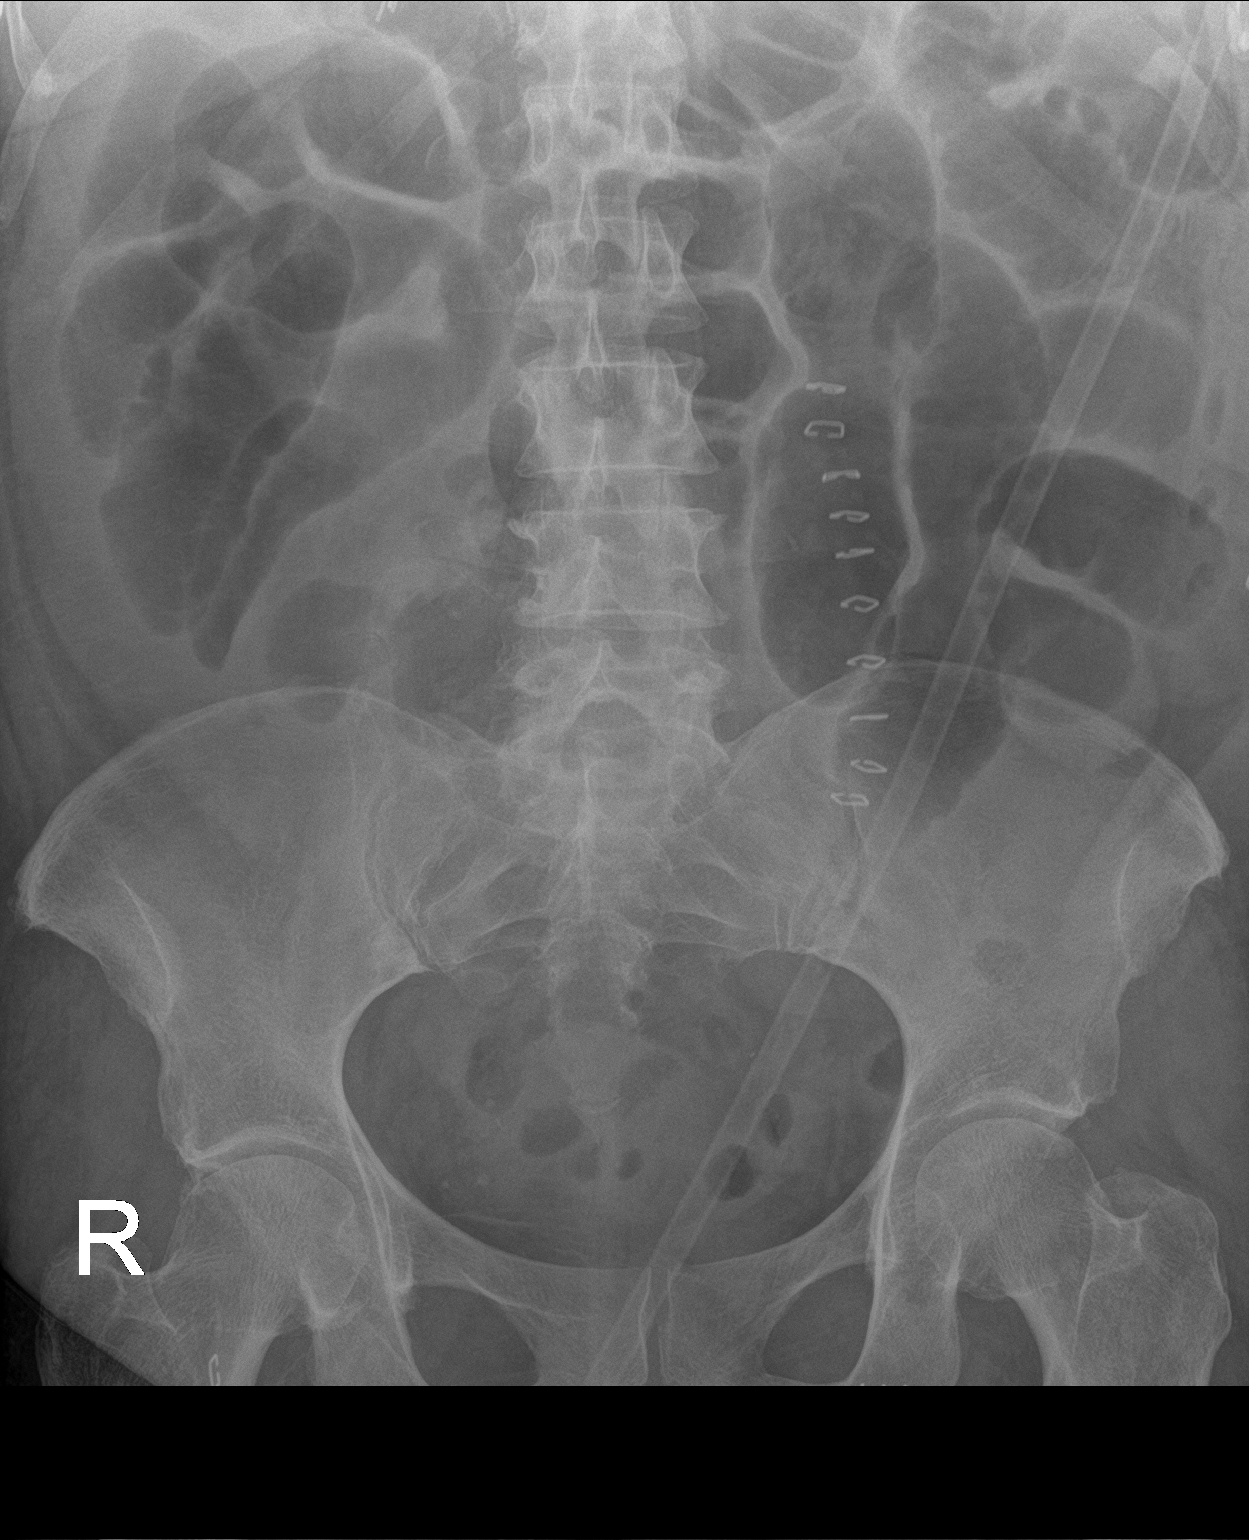

[abdomen kub (2 of 2)]
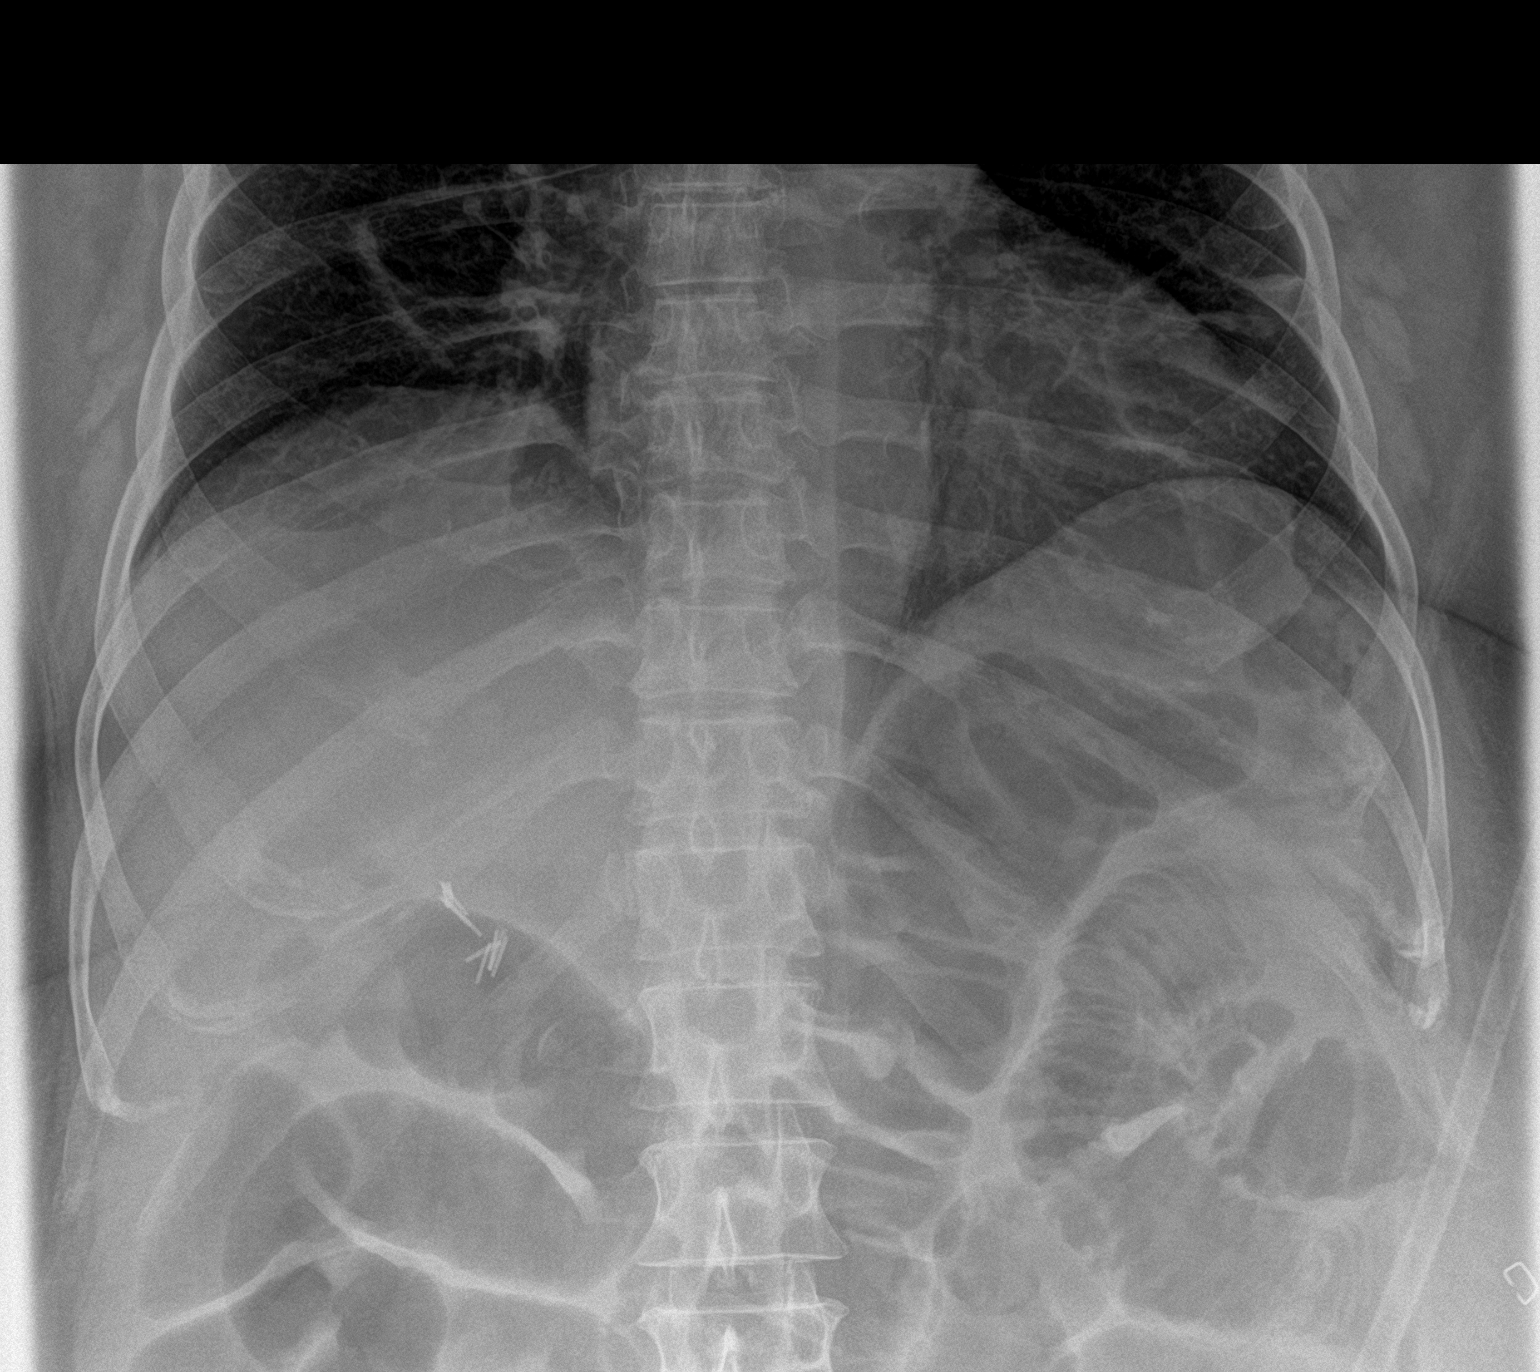

[2 of 2 positions shown; findings below may reference images not displayed]

FINDINGS: There are several loops of dilated bowel without appreciable
air-fluid level. No free air. There are surgical clips in the right
upper quadrant. There are skin staples slightly to the left of
midline in the abdomen as well as overlying portions of the lower
pelvis. There is mild bibasilar lung atelectatic change.
IMPRESSION: Loops of dilated small bowel without appreciable air-fluid levels.
Suspect a degree of ileus or enteritis. Bowel obstruction felt to be
less likely. No free air. Mild bibasilar atelectasis.

## 2017-04-28 IMAGING — DX DG ABDOMEN 1V
2 series · 2 of 2 positions shown · non-contrast
Comparison: None.

CLINICAL DATA: Ileus. Pain left lower quadrant of abdomen. Pt. Had
colectomy 6 days ago to remove a colon mass.

EXAM:
ABDOMEN - 1 VIEW

[abdomen kub (1 of 2)]
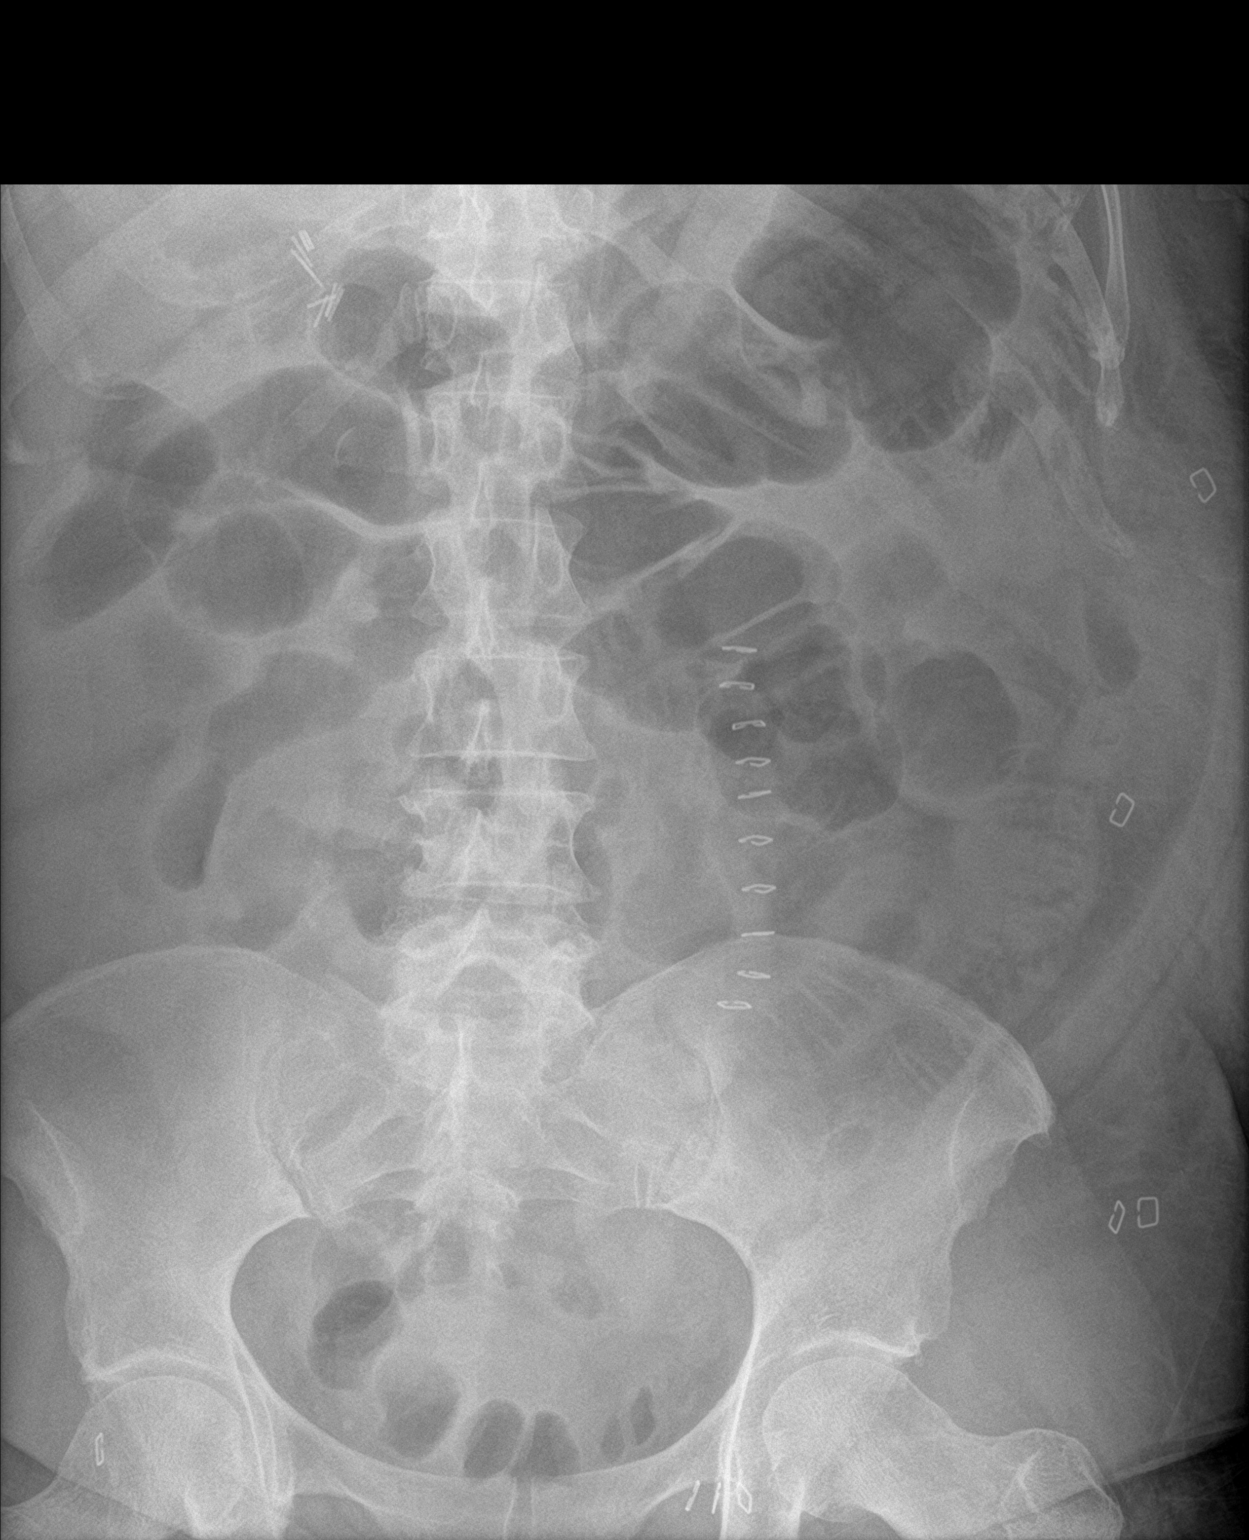

[abdomen kub (2 of 2)]
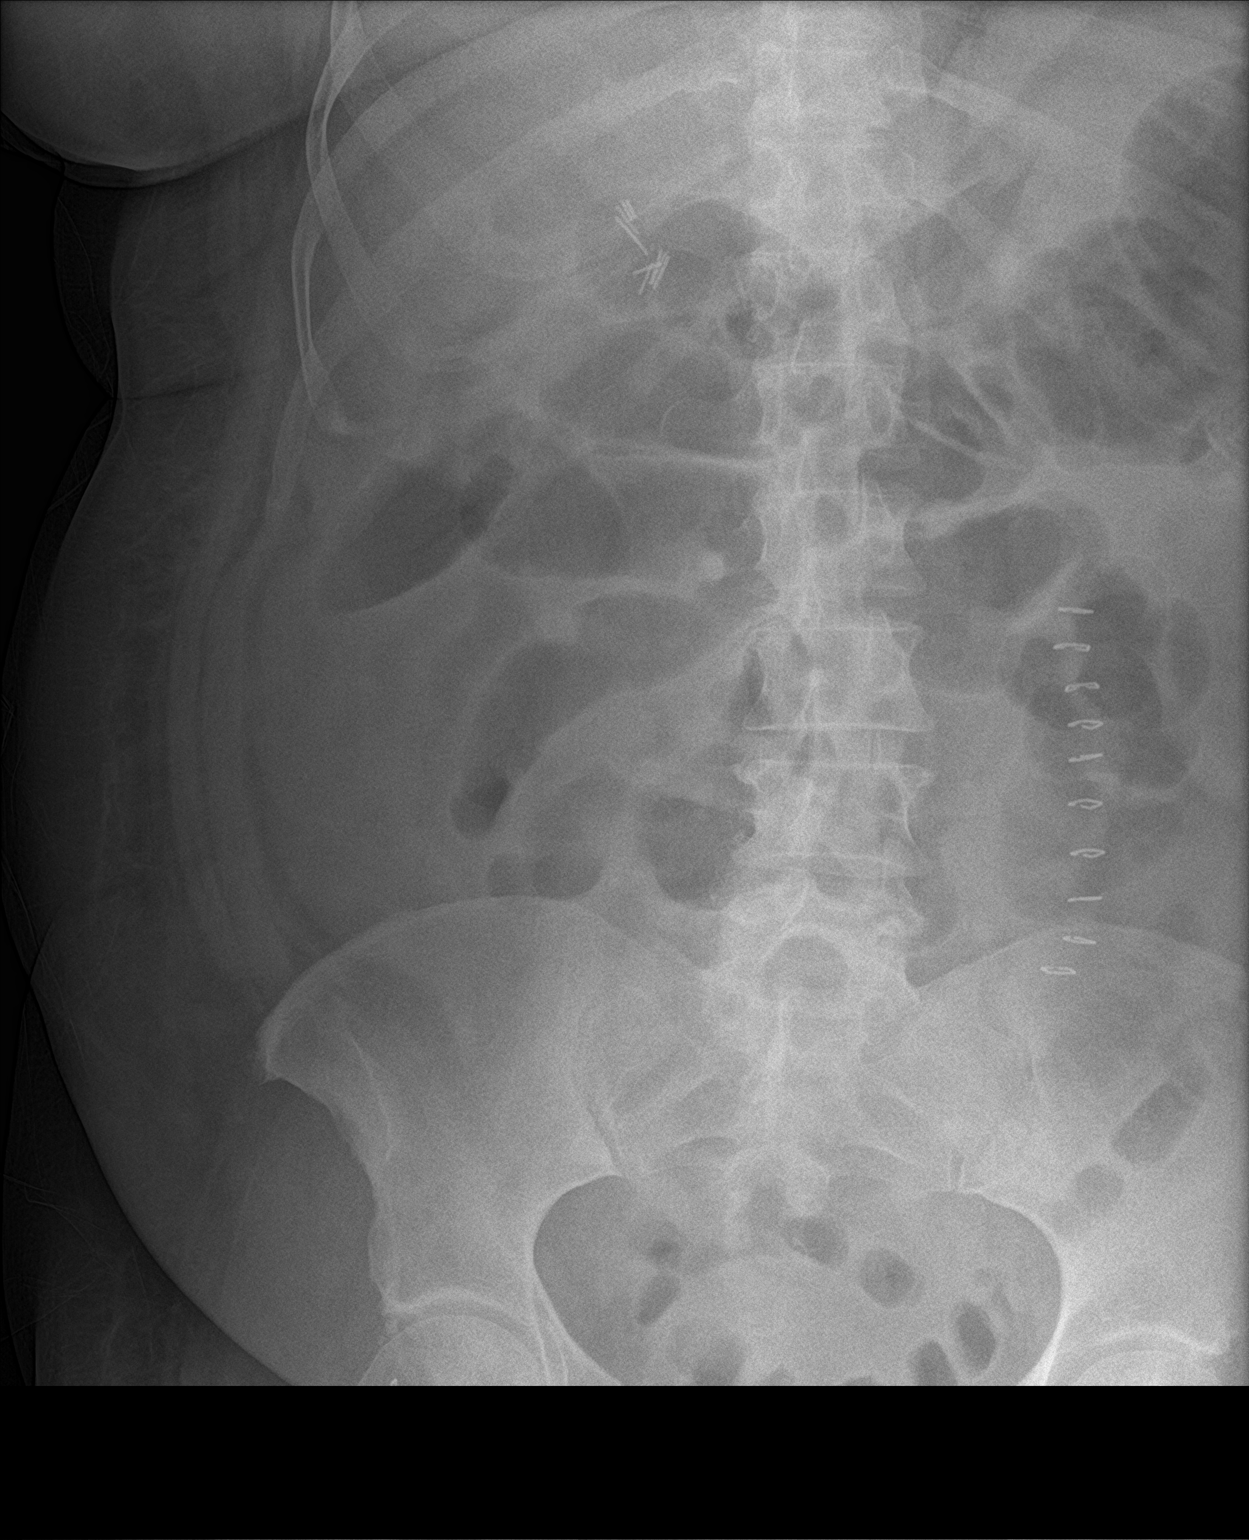

[2 of 2 positions shown; findings below may reference images not displayed]

FINDINGS: Mild persistent gaseous distention of small bowel and colon as can
be seen with an ileus. The degree of distention has improved
compared with 07/07/2016. There is no evidence of pneumoperitoneum,
portal venous gas or pneumatosis. There are no pathologic
calcifications along the expected course of the ureters. Left
paramedian surgical staples are noted.The osseous structures are
unremarkable.
IMPRESSION: Mild persistent gaseous distention of small bowel and colon as can
be seen with an ileus. The degree of distention has improved
compared with 07/07/2016.

## 2017-05-27 ENCOUNTER — Encounter: Payer: Self-pay | Admitting: Nurse Practitioner

## 2017-06-19 ENCOUNTER — Encounter: Payer: Self-pay | Admitting: Gastroenterology

## 2017-06-29 ENCOUNTER — Telehealth: Payer: Self-pay

## 2017-06-29 NOTE — Telephone Encounter (Signed)
Patient called asking about a "prescription" to pick up before her CT scan. Attempted to return patient call, no answer. Patient presented to clinic, Mackie Pai, RN provided patient with contrast needed for scan.

## 2017-06-30 ENCOUNTER — Ambulatory Visit (HOSPITAL_COMMUNITY)
Admission: RE | Admit: 2017-06-30 | Discharge: 2017-06-30 | Disposition: A | Discharge status: Home / Self Care | Payer: BC Managed Care – PPO | Source: Ambulatory Visit | Attending: Nurse Practitioner | Admitting: Nurse Practitioner

## 2017-06-30 ENCOUNTER — Inpatient Hospital Stay: Payer: BC Managed Care – PPO | Attending: Oncology

## 2017-06-30 ENCOUNTER — Encounter (HOSPITAL_COMMUNITY): Payer: Self-pay

## 2017-06-30 DIAGNOSIS — I10 Essential (primary) hypertension: Secondary | ICD-10-CM | POA: Diagnosis not present

## 2017-06-30 DIAGNOSIS — Z9889 Other specified postprocedural states: Secondary | ICD-10-CM | POA: Diagnosis not present

## 2017-06-30 DIAGNOSIS — R110 Nausea: Secondary | ICD-10-CM | POA: Diagnosis not present

## 2017-06-30 DIAGNOSIS — C182 Malignant neoplasm of ascending colon: Secondary | ICD-10-CM | POA: Insufficient documentation

## 2017-06-30 DIAGNOSIS — E041 Nontoxic single thyroid nodule: Secondary | ICD-10-CM | POA: Diagnosis not present

## 2017-06-30 DIAGNOSIS — C189 Malignant neoplasm of colon, unspecified: Secondary | ICD-10-CM

## 2017-06-30 DIAGNOSIS — R51 Headache: Secondary | ICD-10-CM | POA: Insufficient documentation

## 2017-06-30 LAB — COMPREHENSIVE METABOLIC PANEL
ALBUMIN: 3.9 g/dL (ref 3.5–5.0)
ALT: 19 U/L (ref 0–55)
AST: 19 U/L (ref 5–34)
Alkaline Phosphatase: 125 U/L (ref 40–150)
Anion gap: 8 (ref 3–11)
BUN: 8 mg/dL (ref 7–26)
CHLORIDE: 106 mmol/L (ref 98–109)
CO2: 27 mmol/L (ref 22–29)
Calcium: 10.3 mg/dL (ref 8.4–10.4)
Creatinine, Ser: 0.74 mg/dL (ref 0.60–1.10)
GFR calc Af Amer: >60 mL/min (ref >60)
GFR calc non Af Amer: >60 mL/min (ref >60)
GLUCOSE: 98 mg/dL (ref 70–140)
POTASSIUM: 4.1 mmol/L (ref 3.3–4.7)
Sodium: 141 mmol/L (ref 136–145)
Total Bilirubin: 0.5 mg/dL (ref 0.2–1.2)
Total Protein: 8.2 g/dL (ref 6.4–8.3)

## 2017-06-30 LAB — CBC WITH DIFFERENTIAL/PLATELET
Basophils Absolute: 0.1 10*3/uL (ref 0.0–0.1)
Basophils Relative: 1 %
EOS PCT: 5 %
Eosinophils Absolute: 0.3 10*3/uL (ref 0.0–0.5)
HEMATOCRIT: 44.8 % (ref 34.8–46.6)
Hemoglobin: 14.6 g/dL (ref 11.6–15.9)
LYMPHS ABS: 2 10*3/uL (ref 0.9–3.3)
LYMPHS PCT: 30 %
MCH: 27.5 pg (ref 25.1–34.0)
MCHC: 32.6 g/dL (ref 31.5–36.0)
MCV: 84.3 fL (ref 79.5–101.0)
MONO ABS: 0.5 10*3/uL (ref 0.1–0.9)
Monocytes Relative: 7 %
Neutro Abs: 3.9 10*3/uL (ref 1.5–6.5)
Neutrophils Relative %: 57 %
PLATELETS: 278 10*3/uL (ref 145–400)
RBC: 5.31 MIL/uL (ref 3.70–5.45)
RDW: 14.8 % (ref 11.2–16.1)
WBC: 6.8 10*3/uL (ref 3.9–10.3)

## 2017-06-30 LAB — CEA (IN HOUSE-CHCC): CEA (CHCC-In House): 1.41 ng/mL (ref 0.00–5.00)

## 2017-06-30 MED ORDER — IOPAMIDOL (ISOVUE-300) INJECTION 61%
100.0000 mL | Freq: Once | INTRAVENOUS | Status: AC | PRN
  Administered 2017-06-30: 100 mL via INTRAVENOUS

## 2017-06-30 MED ORDER — IOPAMIDOL (ISOVUE-300) INJECTION 61%
INTRAVENOUS | Status: AC
  Administered 2017-06-30: 100 mL via INTRAVENOUS
  Filled 2017-06-30: qty 100

## 2017-07-03 ENCOUNTER — Inpatient Hospital Stay (HOSPITAL_BASED_OUTPATIENT_CLINIC_OR_DEPARTMENT_OTHER): Payer: BC Managed Care – PPO | Admitting: Oncology

## 2017-07-03 VITALS — BP 160/57 | HR 80 | Temp 98.3°F | Resp 18 | Ht 64.0 in | Wt 223.8 lb

## 2017-07-03 DIAGNOSIS — I10 Essential (primary) hypertension: Secondary | ICD-10-CM

## 2017-07-03 DIAGNOSIS — E041 Nontoxic single thyroid nodule: Secondary | ICD-10-CM | POA: Diagnosis not present

## 2017-07-03 DIAGNOSIS — R51 Headache: Secondary | ICD-10-CM

## 2017-07-03 DIAGNOSIS — R110 Nausea: Secondary | ICD-10-CM | POA: Diagnosis not present

## 2017-07-03 DIAGNOSIS — C182 Malignant neoplasm of ascending colon: Secondary | ICD-10-CM

## 2017-07-03 DIAGNOSIS — C189 Malignant neoplasm of colon, unspecified: Secondary | ICD-10-CM

## 2017-07-03 NOTE — Progress Notes (Signed)
Becky Brooks OFFICE PROGRESS NOTE   Diagnosis: Colon cancer  INTERVAL HISTORY:   Becky Brooks returns as scheduled.  She reports a headache for the past few weeks.  She has chronic headaches that she relates to neck pain, but this headache is different.  She has a frontal headache and nausea.  No vomiting or fever.  She reports dark spots in the right eye visual field. No diarrhea.  Objective:  Vital signs in last 24 hours:  Blood pressure (!) 160/57, pulse 80, temperature 98.3 F (36.8 C), temperature source Oral, resp. rate 18, height '5\' 4"'  (1.626 m), weight 223 lb 12.8 oz (101.5 kg), SpO2 98 %.    HEENT: Neck without mass.  The neck is supple.  The extraocular movements appear intact.  No nystagmus.  No sinus tenderness Lymphatics: No cervical, supraclavicular, axillary, or inguinal nodes Resp: Lungs clear bilaterally Cardio: Regular rate and rhythm GI: No hepatosplenomegaly Vascular: No leg edema     Lab Results:  Lab Results  Component Value Date   WBC 6.8 06/30/2017   HGB 14.6 06/30/2017   HCT 44.8 06/30/2017   MCV 84.3 06/30/2017   PLT 278 06/30/2017   NEUTROABS 3.9 06/30/2017    CMP     Component Value Date/Time   NA 141 06/30/2017 1102   NA 139 01/28/2017 1116   K 4.1 06/30/2017 1102   K 3.8 01/28/2017 1116   CL 106 06/30/2017 1102   CO2 27 06/30/2017 1102   CO2 24 01/28/2017 1116   GLUCOSE 98 06/30/2017 1102   GLUCOSE 141 (H) 01/28/2017 1116   BUN 8 06/30/2017 1102   BUN 11.0 01/28/2017 1116   CREATININE 0.74 06/30/2017 1102   CREATININE 0.7 01/28/2017 1116   CALCIUM 10.3 06/30/2017 1102   CALCIUM 10.5 (H) 01/28/2017 1116   PROT 8.2 06/30/2017 1102   PROT 8.3 01/28/2017 1116   ALBUMIN 3.9 06/30/2017 1102   ALBUMIN 3.8 01/28/2017 1116   AST 19 06/30/2017 1102   AST 19 01/28/2017 1116   ALT 19 06/30/2017 1102   ALT 18 01/28/2017 1116   ALKPHOS 125 06/30/2017 1102   ALKPHOS 129 01/28/2017 1116   BILITOT 0.5 06/30/2017 1102   BILITOT 0.54 01/28/2017 1116   GFRNONAA >60 06/30/2017 1102   GFRAA >60 06/30/2017 1102    Lab Results  Component Value Date   CEA1 1.41 06/30/2017    No results found for: INR  Imaging:  Ct Chest W Contrast  Result Date: 06/30/2017 CLINICAL DATA:  Colon cancer, status post resection in December 2017. EXAM: CT CHEST, ABDOMEN, AND PELVIS WITH CONTRAST TECHNIQUE: Multidetector CT imaging of the chest, abdomen and pelvis was performed following the standard protocol during bolus administration of intravenous contrast. CONTRAST:  100 mL Isovue 300 IV COMPARISON:  CT chest dated 06/11/2016. CT abdomen/pelvis dated 06/03/2016. FINDINGS: CT CHEST FINDINGS Cardiovascular: The heart is normal in size. No evidence of pericardial effusion. No evidence of thoracic aortic aneurysm. Mild coronary atherosclerosis of the LAD and left circumflex. Mediastinum/Nodes: No suspicious mediastinal lymphadenopathy. Visualized thyroid is notable for a 3.1 cm left isthmic nodule (series 2/image 6), unchanged. Lungs/Pleura: 4 mm left upper lobe pulmonary nodule (series 4/image 54), unchanged, benign. 6 mm triangular subpleural nodule in the left upper lobe (series 4/image 52), unchanged, likely reflecting a benign subpleural lymph node. No suspicious pulmonary nodules. No focal consolidation. No pleural effusion or pneumothorax. Musculoskeletal: Visualized osseous structures are within normal limits. CT ABDOMEN PELVIS FINDINGS Hepatobiliary: Liver is within normal  limits, noting probable hepatic steatosis. Status post cholecystectomy. No intrahepatic or extrahepatic ductal dilatation. Pancreas: Within normal limits. Spleen: Within normal limits. Adrenals/Urinary Tract: Adrenal glands are within normal limits. Kidneys are within normal limits.  No hydronephrosis. Bladder is mildly thick-walled although underdistended. Stomach/Bowel: Stomach is within normal limits. No evidence of bowel obstruction. Status post right  hemicolectomy with appendectomy. Vascular/Lymphatic: No evidence of abdominal aortic aneurysm. No suspicious abdominopelvic lymphadenopathy. Reproductive: Uterus is within normal limits. Bilateral ovaries are within normal limits. Other: No abdominopelvic ascites. Musculoskeletal: Mild degenerative changes at L5-S1. IMPRESSION: No evidence of recurrent or metastatic disease. Status post right hemicolectomy. **An incidental finding of potential clinical significance has been found. 3.1 cm isthmic thyroid nodule, unchanged. Consider thyroid ultrasound for further evaluation, as clinically warranted.** Electronically Signed   By: Julian Hy M.D.   On: 06/30/2017 15:32   Ct Abdomen Pelvis W Contrast  Result Date: 06/30/2017 CLINICAL DATA:  Colon cancer, status post resection in December 2017. EXAM: CT CHEST, ABDOMEN, AND PELVIS WITH CONTRAST TECHNIQUE: Multidetector CT imaging of the chest, abdomen and pelvis was performed following the standard protocol during bolus administration of intravenous contrast. CONTRAST:  100 mL Isovue 300 IV COMPARISON:  CT chest dated 06/11/2016. CT abdomen/pelvis dated 06/03/2016. FINDINGS: CT CHEST FINDINGS Cardiovascular: The heart is normal in size. No evidence of pericardial effusion. No evidence of thoracic aortic aneurysm. Mild coronary atherosclerosis of the LAD and left circumflex. Mediastinum/Nodes: No suspicious mediastinal lymphadenopathy. Visualized thyroid is notable for a 3.1 cm left isthmic nodule (series 2/image 6), unchanged. Lungs/Pleura: 4 mm left upper lobe pulmonary nodule (series 4/image 54), unchanged, benign. 6 mm triangular subpleural nodule in the left upper lobe (series 4/image 52), unchanged, likely reflecting a benign subpleural lymph node. No suspicious pulmonary nodules. No focal consolidation. No pleural effusion or pneumothorax. Musculoskeletal: Visualized osseous structures are within normal limits. CT ABDOMEN PELVIS FINDINGS Hepatobiliary:  Liver is within normal limits, noting probable hepatic steatosis. Status post cholecystectomy. No intrahepatic or extrahepatic ductal dilatation. Pancreas: Within normal limits. Spleen: Within normal limits. Adrenals/Urinary Tract: Adrenal glands are within normal limits. Kidneys are within normal limits.  No hydronephrosis. Bladder is mildly thick-walled although underdistended. Stomach/Bowel: Stomach is within normal limits. No evidence of bowel obstruction. Status post right hemicolectomy with appendectomy. Vascular/Lymphatic: No evidence of abdominal aortic aneurysm. No suspicious abdominopelvic lymphadenopathy. Reproductive: Uterus is within normal limits. Bilateral ovaries are within normal limits. Other: No abdominopelvic ascites. Musculoskeletal: Mild degenerative changes at L5-S1. IMPRESSION: No evidence of recurrent or metastatic disease. Status post right hemicolectomy. **An incidental finding of potential clinical significance has been found. 3.1 cm isthmic thyroid nodule, unchanged. Consider thyroid ultrasound for further evaluation, as clinically warranted.** Electronically Signed   By: Julian Hy M.D.   On: 06/30/2017 15:32    Medications: I have reviewed the patient's current medications.   Assessment/Plan: 1. Adenocarcinoma the ascending colon, stage IIIB (T3, N1a), status post a right colectomy 07/03/2016 ? MSI-low, no loss of mismatch repair protein expression ? 1/36 lymph nodes positive, lymphovascular invasion identified ? Normal preoperative CEA ? She declined adjuvant chemotherapy. ? CTs chest, abdomen, and pelvis 06/30/2017-negative for recurrent colon cancer  2. History of iron deficiency anemia secondary to #1.   3. History of hypertension  4.   Mildly elevated calcium 07/30/2016, 01/28/2017. Defer to PCP.  5.   Thyroid is in this nodule noted on CT, noted to have a cystic thyroid isthmus lesion on ultrasound in March 2015   Disposition: Becky Brooks remains  in clinical remission from colon cancer.  She will return for an office visit and CEA in 6 months.  She will discontinue iron therapy as the iron deficiency anemia has corrected.  She reports the thyroid isthmus nodule has been evaluated by Dr. Ernie Hew in the past.  She has a headache and nausea today.  She reports a headache for the past few weeks.  I recommended she follow-up with Dr. Sarina Ill for further evaluation.  She may be having a migraine headache.  15 minutes were spent with the patient today.  The majority of the time was used for counseling and coordination of care.  [name], MD  07/03/2017  12:32 PM

## 2017-12-31 ENCOUNTER — Ambulatory Visit: Payer: Self-pay | Admitting: Nurse Practitioner

## 2017-12-31 ENCOUNTER — Other Ambulatory Visit: Payer: Self-pay

## 2018-08-04 ENCOUNTER — Other Ambulatory Visit: Payer: Self-pay

## 2018-08-04 ENCOUNTER — Emergency Department (HOSPITAL_COMMUNITY)
Admission: EM | Admit: 2018-08-04 | Discharge: 2018-08-04 | Disposition: A | Discharge status: Home / Self Care | Payer: BC Managed Care – PPO | Attending: Emergency Medicine | Admitting: Emergency Medicine

## 2018-08-04 ENCOUNTER — Emergency Department (HOSPITAL_COMMUNITY): Payer: BC Managed Care – PPO

## 2018-08-04 ENCOUNTER — Encounter (HOSPITAL_COMMUNITY): Payer: Self-pay | Admitting: Emergency Medicine

## 2018-08-04 DIAGNOSIS — I10 Essential (primary) hypertension: Secondary | ICD-10-CM | POA: Insufficient documentation

## 2018-08-04 DIAGNOSIS — J45909 Unspecified asthma, uncomplicated: Secondary | ICD-10-CM | POA: Diagnosis not present

## 2018-08-04 DIAGNOSIS — N201 Calculus of ureter: Secondary | ICD-10-CM | POA: Diagnosis not present

## 2018-08-04 DIAGNOSIS — Z79899 Other long term (current) drug therapy: Secondary | ICD-10-CM | POA: Insufficient documentation

## 2018-08-04 DIAGNOSIS — R109 Unspecified abdominal pain: Secondary | ICD-10-CM | POA: Diagnosis present

## 2018-08-04 LAB — CBC
HCT: 48 % — ABNORMAL HIGH (ref 36.0–46.0)
Hemoglobin: 15.2 g/dL — ABNORMAL HIGH (ref 12.0–15.0)
MCH: 27.8 pg (ref 26.0–34.0)
MCHC: 31.7 g/dL (ref 30.0–36.0)
MCV: 87.9 fL (ref 80.0–100.0)
NRBC: 0 % (ref 0.0–0.2)
PLATELETS: 282 10*3/uL (ref 150–400)
RBC: 5.46 MIL/uL — ABNORMAL HIGH (ref 3.87–5.11)
RDW: 13.2 % (ref 11.5–15.5)
WBC: 11 10*3/uL — ABNORMAL HIGH (ref 4.0–10.5)

## 2018-08-04 LAB — COMPREHENSIVE METABOLIC PANEL
ALK PHOS: 87 U/L (ref 38–126)
ALT: 32 U/L (ref 0–44)
ANION GAP: 7 (ref 5–15)
AST: 44 U/L — ABNORMAL HIGH (ref 15–41)
Albumin: 3.8 g/dL (ref 3.5–5.0)
BUN: 11 mg/dL (ref 8–23)
CALCIUM: 10.1 mg/dL (ref 8.9–10.3)
CO2: 26 mmol/L (ref 22–32)
Chloride: 104 mmol/L (ref 98–111)
Creatinine, Ser: 0.71 mg/dL (ref 0.44–1.00)
GFR calc Af Amer: >60 mL/min (ref >60)
GFR calc non Af Amer: >60 mL/min (ref >60)
Glucose, Bld: 143 mg/dL — ABNORMAL HIGH (ref 70–99)
POTASSIUM: 4.7 mmol/L (ref 3.5–5.1)
SODIUM: 137 mmol/L (ref 135–145)
TOTAL PROTEIN: 7.7 g/dL (ref 6.5–8.1)
Total Bilirubin: 1.6 mg/dL — ABNORMAL HIGH (ref 0.3–1.2)

## 2018-08-04 LAB — URINALYSIS, ROUTINE W REFLEX MICROSCOPIC
BILIRUBIN URINE: NEGATIVE
Glucose, UA: NEGATIVE mg/dL
Ketones, ur: NEGATIVE mg/dL
NITRITE: NEGATIVE
PH: 7 (ref 5.0–8.0)
Protein, ur: 30 mg/dL — AB
RBC / HPF: >50 RBC/hpf — ABNORMAL HIGH (ref 0–5)
SPECIFIC GRAVITY, URINE: 1.019 (ref 1.005–1.030)

## 2018-08-04 LAB — LIPASE, BLOOD: Lipase: 27 U/L (ref 11–51)

## 2018-08-04 MED ORDER — TAMSULOSIN HCL 0.4 MG PO CAPS
0.4000 mg | ORAL_CAPSULE | Freq: Once | ORAL | Status: AC
  Administered 2018-08-04: 0.4 mg via ORAL
  Filled 2018-08-04: qty 1

## 2018-08-04 MED ORDER — IOHEXOL 300 MG/ML  SOLN
100.0000 mL | Freq: Once | INTRAMUSCULAR | Status: AC | PRN
  Administered 2018-08-04: 100 mL via INTRAVENOUS

## 2018-08-04 MED ORDER — CEPHALEXIN 250 MG PO CAPS
500.0000 mg | ORAL_CAPSULE | Freq: Once | ORAL | Status: AC
  Administered 2018-08-04: 500 mg via ORAL
  Filled 2018-08-04: qty 2

## 2018-08-04 MED ORDER — MORPHINE SULFATE (PF) 4 MG/ML IV SOLN
4.0000 mg | Freq: Once | INTRAVENOUS | Status: AC
  Administered 2018-08-04: 4 mg via INTRAVENOUS
  Filled 2018-08-04: qty 1

## 2018-08-04 MED ORDER — SODIUM CHLORIDE 0.9% FLUSH: 3.0000 mL | Freq: Once | INTRAVENOUS | Status: DC

## 2018-08-04 MED ORDER — HYDROCODONE-ACETAMINOPHEN 5-325 MG PO TABS
1.0000 | ORAL_TABLET | Freq: Four times a day (QID) | ORAL | 0 refills | Status: AC | PRN
Start: 2018-08-04 — End: ?

## 2018-08-04 MED ORDER — TAMSULOSIN HCL 0.4 MG PO CAPS: 0.4000 mg | ORAL_CAPSULE | Freq: Every day | ORAL | 0 refills | Status: AC

## 2018-08-04 MED ORDER — ONDANSETRON HCL 4 MG/2ML IJ SOLN
4.0000 mg | Freq: Once | INTRAMUSCULAR | Status: AC
  Administered 2018-08-04: 4 mg via INTRAVENOUS
  Filled 2018-08-04: qty 2

## 2018-08-04 MED ORDER — ONDANSETRON 4 MG PO TBDP
4.0000 mg | ORAL_TABLET | Freq: Three times a day (TID) | ORAL | 0 refills | Status: AC | PRN
Start: 2018-08-04 — End: ?

## 2018-08-04 MED ORDER — CEPHALEXIN 500 MG PO CAPS: 500.0000 mg | ORAL_CAPSULE | Freq: Four times a day (QID) | ORAL | 0 refills | Status: AC

## 2018-08-04 NOTE — ED Provider Notes (Signed)
[place] EMERGENCY DEPARTMENT Provider Note   CSN: [id number] Arrival date & time: 08/04/18  1607    History   Chief Complaint Chief Complaint  Patient presents with  . Abdominal Pain  . Flank Pain    HPI Becky Brooks is a 63 y.o. female with past medical history of colon cancer, status post right-sided colectomy, anxiety, asthma, GERD, who presents today for evaluation of right-sided abdominal pain.  She reports that the pain started this morning and seems to wax and wane.  When the pain is bad she is nauseous and has vomited.  She also reports hematuria.  She reports increased frequency however denies dysuria or urgency.  She does not have a history of kidney stones.  She reports that she occasionally has pain in her right side since she had her partial colectomy, however this feels different.    Nothing makes the pain better or worse.      HPI  Past Medical History:  Diagnosis Date  . Anemia   . Anxiety   . Arthritis   . Asthma   . Essential hypertension    no meds  . Fatigue   . GERD (gastroesophageal reflux disease)   . History of blood transfusion 05/2016  . Vitamin D deficiency     Patient Active Problem List   Diagnosis Date Noted  . Colon cancer ([place]) 07/03/2016  . Anemia, iron deficiency 05/29/2016  . SNORING 10/14/2007  . COUGH 10/14/2007    Past Surgical History:  Procedure Laterality Date  . CARPAL TUNNEL RELEASE    . CHOLECYSTECTOMY    . COLONOSCOPY  06/05/2016  . LAPAROSCOPIC RIGHT COLECTOMY Right 07/03/2016   Procedure: LAPAROSCOPIC ASSISTED RIGHT COLECTOMY;  Surgeon: Rolm Bookbinder, MD;  Location: [facility];  Service: General;  Laterality: Right;  . REDUCTION MAMMAPLASTY    . ROTATOR CUFF REPAIR    . SHOULDER ARTHROSCOPY Right 07/30/2004     OB History   No obstetric history on file.      Home Medications    Prior to Admission medications   Medication Sig Start Date End Date Taking? Authorizing Provider    acetaminophen (TYLENOL) 325 MG tablet Take 650 mg by mouth every 6 (six) hours as needed for mild pain.    Yes [provider]  methocarbamol (ROBAXIN) 750 MG tablet Take 1 tablet (750 mg total) by mouth 4 (four) times daily as needed (use for muscle cramps/pain). 07/11/16  Yes Rolm Bookbinder, MD  albuterol (ACCUNEB) 0.63 MG/3ML nebulizer solution Take 1 ampule by nebulization See admin instructions. 3 to 4 times daily as needed for wheezing/shortness of breath. 03/06/16   [provider]  cephALEXin (KEFLEX) 500 MG capsule Take 1 capsule (500 mg total) by mouth 4 (four) times daily for 7 days. 08/04/18 08/11/18  Lorin Glass, PA-C  HYDROcodone-acetaminophen (NORCO/VICODIN) 5-325 MG tablet Take 1 tablet by mouth every 6 (six) hours as needed. 08/04/18   Lorin Glass, PA-C  ondansetron (ZOFRAN ODT) 4 MG disintegrating tablet Take 1 tablet (4 mg total) by mouth every 8 (eight) hours as needed for nausea or vomiting. 08/04/18   Lorin Glass, PA-C  tamsulosin (FLOMAX) 0.4 MG CAPS capsule Take 1 capsule (0.4 mg total) by mouth daily. 08/04/18   Lorin Glass, PA-C    Family History Family History  Problem Relation Age of Onset  . Heart disease Father   . Thyroid disease Father        and pituitary gland   .  Thyroid disease Sister   . Liver disease Maternal Grandfather   . Colon cancer Neg Hx   . Stomach cancer Neg Hx   . Rectal cancer Neg Hx   . Esophageal cancer Neg Hx   . Liver cancer Neg Hx     Social History Social History   Tobacco Use  . Smoking status: Never Smoker  . Smokeless tobacco: Never Used  Substance Use Topics  . Alcohol use: Yes    Comment: 07/03/2016 "may have a drink q 2 yrs; very rarely drink"  . Drug use: No     Allergies   Aspirin and Codeine   Review of Systems Review of Systems  Constitutional: Negative for chills and fever.  Respiratory: Negative for chest tightness and shortness of breath.    Gastrointestinal: Positive for abdominal pain, nausea and vomiting. Negative for constipation and diarrhea.  Genitourinary: Positive for frequency and hematuria. Negative for dysuria and flank pain.  Musculoskeletal: Negative for back pain, neck pain and neck stiffness.  Skin: Negative for color change.  Neurological: Negative for weakness and headaches.  All other systems reviewed and are negative.    Physical Exam Updated Vital Signs BP (!) 151/95   Pulse 85   Temp 97.6 F (36.4 C) (Oral)   Resp 16   Ht 5\' 4"  (1.626 m)   Wt 88.5 kg   SpO2 95%   BMI 33.47 kg/m   Physical Exam Vitals signs and nursing note reviewed.  Constitutional:      General: She is not in acute distress.    Appearance: She is well-developed. She is not ill-appearing.  HENT:     Head: Normocephalic and atraumatic.  Eyes:     Conjunctiva/sclera: Conjunctivae normal.  Neck:     Musculoskeletal: Neck supple.  Cardiovascular:     Rate and Rhythm: Normal rate and regular rhythm.     Heart sounds: No murmur.  Pulmonary:     Effort: Pulmonary effort is normal. No respiratory distress.     Breath sounds: Normal breath sounds.  Abdominal:     General: Abdomen is flat. Bowel sounds are normal. There is no distension.     Palpations: Abdomen is soft.     Tenderness: There is abdominal tenderness in the right upper quadrant and right lower quadrant.     Hernia: No hernia is present.  Skin:    General: Skin is warm and dry.  Neurological:     General: No focal deficit present.     Mental Status: She is alert.  Psychiatric:        Mood and Affect: Mood normal.        Behavior: Behavior normal.      ED Treatments / Results  Labs (all labs ordered are listed, but only abnormal results are displayed) Labs Reviewed  COMPREHENSIVE METABOLIC PANEL - Abnormal; Notable for the following components:      Result Value   Glucose, Bld 143 (*)    AST 44 (*)    Total Bilirubin 1.6 (*)    All other  components within normal limits  CBC - Abnormal; Notable for the following components:   WBC 11.0 (*)    RBC 5.46 (*)    Hemoglobin 15.2 (*)    HCT 48.0 (*)    All other components within normal limits  URINALYSIS, ROUTINE W REFLEX MICROSCOPIC - Abnormal; Notable for the following components:   APPearance HAZY (*)    Hgb urine dipstick LARGE (*)    Protein,  ur 30 (*)    Leukocytes,Ua LARGE (*)    RBC / HPF >50 (*)    Bacteria, UA FEW (*)    All other components within normal limits  URINE CULTURE  LIPASE, BLOOD    EKG None  Radiology Ct Abdomen Pelvis W Contrast  Result Date: 08/04/2018 CLINICAL DATA:  63 year old female with right abdominal and flank pain since this morning with nausea. History of colectomy for colon cancer. EXAM: CT ABDOMEN AND PELVIS WITH CONTRAST TECHNIQUE: Multidetector CT imaging of the abdomen and pelvis was performed using the standard protocol following bolus administration of intravenous contrast. CONTRAST:  177mL OMNIPAQUE IOHEXOL 300 MG/ML  SOLN COMPARISON:  CT chest abdomen and pelvis 06/30/2017 and earlier. FINDINGS: Lower chest: Negative. No pericardial or pleural effusion. Hepatobiliary: Chronic hepatic steatosis. No discrete liver lesion. Surgically absent gallbladder. Pancreas: Negative. Spleen: Negative. Adrenals/Urinary Tract: Normal adrenal glands. Negative left kidney and ureter. Diminutive and unremarkable urinary bladder. Fairly symmetric bilateral renal enhancement and contrast excretion, but there is mild right hydronephrosis and a small 3-4 millimeter renal calculus lodged at the right ureteropelvic junction (coronal series 6 image 68 and series 3, image 41). There is some contrast excretion beyond the calculus. The right ureter distal to the calculus is decompressed. No other urinary calculus. Stomach/Bowel: Negative appearing rectum and sigmoid colon, mildly redundant sigmoid. There is a small bowel to large bowel anastomosis at the hepatic  flexure, no adverse features. Mild retained stool in the large bowel. No dilated small bowel. Decompressed stomach and duodenum. No mesenteric stranding. Stable small fat containing umbilical hernia. No free air, free fluid. Vascular/Lymphatic: Major arterial structures are patent. Portal venous system is patent. No lymphadenopathy. Reproductive: Negative. Other: No pelvic free fluid. Musculoskeletal: No acute osseous abnormality identified. IMPRESSION: 1. Mild acute obstructive uropathy on the right with a 3-4 mm calculus lodged at the right UPJ. 2. No other acute process.  No metastatic disease identified. 3. Chronic fatty liver disease. Electronically Signed   By: Genevie Ann M.D.   On: 08/04/2018 19:29    Procedures Procedures (including critical care time)  Medications Ordered in ED Medications  sodium chloride flush (NS) 0.9 % injection 3 mL (3 mLs Intravenous Not Given 08/04/18 1825)  iohexol (OMNIPAQUE) 300 MG/ML solution 100 mL (100 mLs Intravenous Contrast Given 08/04/18 1916)  tamsulosin (FLOMAX) capsule 0.4 mg (0.4 mg Oral Given 08/04/18 2126)  cephALEXin (KEFLEX) capsule 500 mg (500 mg Oral Given 08/04/18 2126)  morphine 4 MG/ML injection 4 mg (4 mg Intravenous Given 08/04/18 2126)  ondansetron (ZOFRAN) injection 4 mg (4 mg Intravenous Given 08/04/18 2126)  morphine 4 MG/ML injection 4 mg (4 mg Intravenous Given 08/04/18 2222)     Initial Impression / Assessment and Plan / ED Course  I have reviewed the triage vital signs and the nursing notes.  Pertinent labs & imaging results that were available during my care of the patient were reviewed by me and considered in my medical decision making (see chart for details).  Clinical Course as of Aug 04 2305  Wed Aug 04, 2018  2051 Spoke with Dr. Clydene Laming from urology about this patient.  Suspect that urine is reactive to kidney stone rather than truly infected based on lack of dysuria, no nitrites, and 6-10 squamous cells with few bacteria suspect  that this is slightly contaminated and reactive.  White count and creatinine are not very concerning given pain.  Will treat with Keflex until cultures come back.   [EH]  2212  Patient reevaluated, she says her pain is a 7-8 out of 10.  Will order additional medicine.   [EH]  2300 Patient reevaluated, her pain is better and she wishes to go home at this time.   [EH]    Clinical Course User Index [EH] Lorin Glass, PA-C      Patient presents today for evaluation of right-sided abdominal pain and hematuria.  Labs were obtained and reviewed, her hemoglobin is slightly elevated at 15.2, white count is slightly elevated at 11.0.  Suspect white count elevation is reactive secondary to pain.  Her CMP has a slight total bilirubin elevation at 1.6 with AST slightly elevated at 44.   With her hematuria and pain concern for kidney stone, however she also has had a colectomy recently which could be contributing to her pain therefore CT abdomen pelvis with contrast was obtained.  A 3 to 4 mm right UPJ calculus is seen with mild obstructive uropathy.  No other acute findings noted. Her urine shows large blood, 30 protein, nitrate negative with large leukocyte esterase.  Microscopy shows over 50 RBC, 21-50 WBC, few bacteria with 6-10 squamous epithelial cells.  Suspect that this mostly represents reactive process from the kidney stone combined with slight contamination, however urine is sent for culture.  Patient does not have a urologist.  I spoke with on-call urologist Dr. Clydene Laming who recommends that,to be conservative, starting patient on antibiotics until her cultures grow out.  She is also given a urine strainer to go home with and Flomax.  PMP was consulted after which she was given a prescription for Vicodin for pain.  Additionally given prescription for Zofran to help with nausea.  She is instructed that if her symptoms return or worsen she should seek additional medical care, preferably at [facility] long  emergency room.  She is afebrile, not tachycardic or tachypneic, she was able to pass p.o. challenge and her symptoms were adequately managed in the emergency room.  She was instructed that her blood pressure is high and she needs to get it checked in one week.   Return precautions were discussed with patient who states their understanding.  At the time of discharge patient denied any unaddressed complaints or concerns.  Patient is agreeable for discharge home.   Final Clinical Impressions(s) / ED Diagnoses   Final diagnoses:  Right ureteral stone    ED Discharge Orders         Ordered    cephALEXin (KEFLEX) 500 MG capsule  4 times daily     08/04/18 2306    HYDROcodone-acetaminophen (NORCO/VICODIN) 5-325 MG tablet  Every 6 hours PRN     08/04/18 2306    ondansetron (ZOFRAN ODT) 4 MG disintegrating tablet  Every 8 hours PRN     08/04/18 2306    tamsulosin (FLOMAX) 0.4 MG CAPS capsule  Daily     08/04/18 2306           Lorin Glass, PA-C 08/04/18 2312    Lennice Sites, DO 08/04/18 2313

## 2018-08-04 NOTE — ED Triage Notes (Signed)
C/o R sided abd pain and R flank pain since this morning with nausea, vomited x1, and hematuria.

## 2018-08-04 NOTE — ED Notes (Signed)
Patient transported to CT 

## 2018-08-04 NOTE — Discharge Instructions (Signed)
Today you received medications that may make you sleepy or impair your ability to make decisions.  For the next 24 hours please do not drive, operate heavy machinery, care for a small child with out another adult present, or perform any activities that may cause harm to you or someone else if you were to fall asleep or be impaired.   You are being prescribed a medication which may make you sleepy. Please follow up of listed precautions for at least 24 hours after taking one dose.  You may have diarrhea from the antibiotics.  It is very important that you continue to take the antibiotics even if you get diarrhea unless a medical professional tells you that you may stop taking them.  If you stop too early the bacteria you are being treated for will become stronger and you may need different, more powerful antibiotics that have more side effects and worsening diarrhea.  Please stay well hydrated and consider probiotics as they may decrease the severity of your diarrhea.

## 2018-08-05 LAB — URINE CULTURE
